# Patient Record
Sex: Female | Born: 1975 | Race: Black or African American | Hispanic: No | Marital: Married | Smoking: Never smoker
Health system: Southern US, Community
[De-identification: ages and names within clinical notes are randomized; demographics above are authoritative.]

## PROBLEM LIST (undated history)

## (undated) DIAGNOSIS — E119 Type 2 diabetes mellitus without complications: Secondary | ICD-10-CM

## (undated) DIAGNOSIS — G40802 Other epilepsy, not intractable, without status epilepticus: Principal | ICD-10-CM

## (undated) DIAGNOSIS — I10 Essential (primary) hypertension: Secondary | ICD-10-CM

## (undated) DIAGNOSIS — O09519 Supervision of elderly primigravida, unspecified trimester: Secondary | ICD-10-CM

## (undated) DIAGNOSIS — R51 Headache: Secondary | ICD-10-CM

## (undated) DIAGNOSIS — G40209 Localization-related (focal) (partial) symptomatic epilepsy and epileptic syndromes with complex partial seizures, not intractable, without status epilepticus: Secondary | ICD-10-CM

## (undated) DIAGNOSIS — R569 Unspecified convulsions: Secondary | ICD-10-CM

## (undated) HISTORY — DX: Morbid (severe) obesity due to excess calories: E66.01

## (undated) HISTORY — PX: ADENOIDECTOMY: SUR15

## (undated) HISTORY — DX: Headache: R51

## (undated) HISTORY — DX: Localization-related (focal) (partial) symptomatic epilepsy and epileptic syndromes with complex partial seizures, not intractable, without status epilepticus: G40.209

## (undated) HISTORY — DX: Other epilepsy, not intractable, without status epilepticus: G40.802

## (undated) HISTORY — PX: TYMPANOSTOMY TUBE PLACEMENT: SHX32

## (undated) HISTORY — PX: WISDOM TOOTH EXTRACTION: SHX21

## (undated) HISTORY — DX: Supervision of elderly primigravida, unspecified trimester: O09.519

## (undated) HISTORY — DX: Unspecified convulsions: R56.9

## (undated) HISTORY — PX: TONSILLECTOMY: SUR1361

---

## 2012-11-30 ENCOUNTER — Other Ambulatory Visit: Payer: Self-pay | Admitting: Neurology

## 2012-12-02 ENCOUNTER — Telehealth: Payer: Self-pay | Admitting: Neurology

## 2012-12-02 NOTE — Telephone Encounter (Signed)
Patient needs to schedule appt. Message sent to schedulers.

## 2012-12-05 ENCOUNTER — Other Ambulatory Visit: Payer: Self-pay | Admitting: Neurology

## 2012-12-07 ENCOUNTER — Encounter: Payer: Self-pay | Admitting: Neurology

## 2012-12-07 ENCOUNTER — Ambulatory Visit (INDEPENDENT_AMBULATORY_CARE_PROVIDER_SITE_OTHER): Payer: BC Managed Care – PPO | Admitting: Neurology

## 2012-12-07 ENCOUNTER — Encounter (INDEPENDENT_AMBULATORY_CARE_PROVIDER_SITE_OTHER): Payer: Self-pay

## 2012-12-07 VITALS — BP 139/79 | HR 80 | Resp 16 | Ht 63.0 in | Wt 215.0 lb

## 2012-12-07 DIAGNOSIS — G40802 Other epilepsy, not intractable, without status epilepticus: Secondary | ICD-10-CM

## 2012-12-07 HISTORY — DX: Other epilepsy, not intractable, without status epilepticus: G40.802

## 2012-12-07 MED ORDER — TEGRETOL-XR 200 MG PO TB12: 200.0000 mg | ORAL_TABLET | Freq: Two times a day (BID) | ORAL | Status: DC

## 2012-12-07 NOTE — Progress Notes (Signed)
[facility]  Provider:  Melvyn Novas, M D  Referring Provider: No ref. provider found Primary Care Physician:  Ihor Gully  Chief Complaint  Patient presents with  . Seizures    Pt states she has 3-4 mini seizures at night. Pt is also in need of her  Tegretol XL 200mg  90 day supply. Pt's vision was tested as well today. Rt eye was 20/30 Lt eye was 20/40 Pt wears glasses full time    HPI:  Laurie Trujillo is a 37 y.o. female  Is seen here as a referral/ revisit  from Dr. Anna Trujillo of [facility] [place].    This is transferred was last seen on 12-02-11 after she had suffered a seizure or presumed to be a seizure on September 13 last year. She described it as feelings when she began to have seizures before she was treated with carbamazepine that had been her or her. She hasn't had a seizure or aura many many years but the seizures that have happened before were related to the late medication intake times or to have forgotten to take medication. She also noted a trigger as possible sleep deprivation. She does not drink alcohol and she has had regular work hours nor nighttime shift work and she was productive but allowed to drive after his last visit with her. The patient has primary partial complex epilepsy and she has been very well controlled on the carbamazepine brand-name Tegretol. She's here today to get her refills.  I gave her copies of our last visit notes and lab tests.     Review of Systems: Out of a complete 14 system review, the patient complains of only the following symptoms, and all other reviewed systems are negative.   Concerned about conceiving and being on antiepileptic medication.   History   Social History  . Marital Status: Unknown    Spouse Name: N/A    Number of Children: 0  . Years of Education: college   Occupational History  .      [employer]   Social History Main Topics  . Smoking status: Never Smoker    . Smokeless tobacco: Not on file  . Alcohol Use: No  . Drug Use: No  . Sexual Activity: Not on file   Other Topics Concern  . Not on file   Social History Narrative   Pt is Rt handed married w/ no children at this time.Pt does take in caffeine at least twice a day.Pt is employed w/ [employer].    Family History  Problem Relation Age of Onset  . Diabetic kidney disease Mother   . Diabetic kidney disease Maternal Grandmother   . High blood pressure Maternal Grandmother   . High blood pressure Paternal Grandmother   . Diabetic kidney disease Maternal Uncle     Past Medical History  Diagnosis Date  . Obesities, morbid   . Complex partial seizures   . Headache(784.0)   . Seizures   . Other forms of epilepsy and recurrent seizures without mention of intractable epilepsy 12/07/2012    History reviewed. No pertinent past surgical history.  Current Outpatient Prescriptions  Medication Sig Dispense Refill  . glyBURIDE (DIABETA) 5 MG tablet       . TEGRETOL-XR 200 MG 12 hr tablet TAKE 2 TABLETS BY MOUTH EVERY MORNING & 3 TABLETS AT BEDTIME  150 tablet  0   No current facility-administered medications for this visit.    Allergies as of  12/07/2012  . (No Known Allergies)    Vitals: BP 139/79  Pulse 80  Resp 16  Ht 5\' 3"  (1.6 m)  Wt 215 lb (97.523 kg)  BMI 38.09 kg/m2 Last Weight:  Wt Readings from Last 1 Encounters:  12/07/12 215 lb (97.523 kg)   Last Height:   Ht Readings from Last 1 Encounters:  12/07/12 5\' 3"  (1.6 m)   Physical exam:  General: The patient is awake, alert and appears not in acute distress. The patient is well groomed. Head: Normocephalic, atraumatic. Neck is supple. Mallampati 3 , neck circumference: 16 , Cardiovascular:  Regular rate and rhythm , without  murmurs or carotid bruit, and without distended neck veins. Respiratory: Lungs are clear to auscultation. Skin:  Without evidence of edema, or rash Trunk: BMI is  elevated and patient   has normal posture.  Neurologic exam : The patient is awake and alert, oriented to place and time.  Memory subjective  described as intact. There is a normal attention span & concentration ability.  Speech is fluent without  dysarthria, dysphonia or aphasia. Mood and affect are appropriate.  Cranial nerves: Pupils are equal and briskly reactive to light. Funduscopic exam without  evidence of pallor or edema. Extraocular movements  in vertical and horizontal planes intact and without nystagmus. Visual fields by finger perimetry are intact. Hearing to finger rub intact.  Facial sensation intact to fine touch. Facial motor strength is symmetric and tongue and uvula move midline.  Motor exam:  Normal tone and normal muscle bulk and symmetric normal strength in all extremities.  Sensory:  Fine touch, pinprick and vibration were tested in all extremities. Proprioception is  normal.  Coordination: Rapid alternating movements in the fingers/hands is tested and normal. Finger-to-nose maneuver tested and normal without evidence of ataxia, dysmetria or tremor.  Gait and station: Patient walks without assistive device and is able and assisted stool climb up to the exam table. Strength within normal limits. Stance is stable and normal. Tandem gait is unfragmented. Romberg testing is  normal.  Deep tendon reflexes: in the  upper and lower extremities are symmetric and intact. Babinski maneuver response is downgoing.   Assessment:  After physical and neurologic examination, review of laboratory studies, imaging, neurophysiology testing and pre-existing records, assessment is   And was rare complex partial seizures and secondary generalization. Controlled on name brand Tegretol 200 mg tablets 2 in the morning 3 in p.m. 5 days total. Also the seizures have been nocturnal and have not needed it he has not been told not to drive but to avoid certain trigger factors such as sleep deprivation, she is not drinking  alcohol , knows that alcohol could be a trigger for seizures,  Plan:  Treatment plan and additional workup :  Refill TEGRETOL 200 mg brand name .  patient  TO  STAY ON tegretol FOR PREGNANCY, NEEDS to discuss with her gynecologist which predated and it measures are indicated at this time. She has been doing very well with his seizure control so I would not like to change her away from Tegretol, which is a category C. for pregnancy.  She has not been on Depakote, Dilantin or phenobarbital which are associated with higher rates of fecal malformation.  I encouraged her to take multivitamins or pregnancy multivitamins prior to conceiving at least 3 months ahead of time.  This is to prevent any vitamin K deficiency and also seems to help reduce the residual risk of  Fetal malformation in epileptic  mothers. I encourage to register with the [facility] pregnancy registry.

## 2012-12-07 NOTE — Patient Instructions (Signed)
Epilepsy and Pregnancy Epilepsy (convulsions) is a lifelong (chronic) disease that causes recurrent convulsions. These episodes are unpredictable. The convulsions happen because of a brain dysfunction when the nerve cells produce too many electrical discharges. These discharges make the body shake. There are no clues to tell whether a patient's epilepsy will be aggravated during pregnancy. Only about one quarter of pregnancies which have been seizure (convulsions) free for the 9 months before pregnancy will have seizures during pregnancy. Most women with epilepsy will have a normal, healthy baby.  RISKS TO BABY:  Partial or complete separation of placenta from uterus (partial/complete uteroplacental separation).  Late-term fetal death (stillborn)  Birth defects. This may be due to the medications used to treat epilepsy. WOMEN WITH EPILEPSY HAVE A HIGHER INCIDENCE OF:  Uncontrolled and continuous nausea and vomiting (hyperemesis gravidarum).  Vaginal bleeding.  Anemia.  Endocrine hormone disorders.  Premature labor.  Failure to progress in labor.  Increased rate of Cesarean Sections.  Menstrual problems.  Polycystic ovary syndrome. CAUSES   Some people are born with the problem.  Head trauma.  High fever over 104 F (40 C).  Brain tumors.  Infection of the brain.  Diabetic patients that took too much insulin and have too low of a blood sugar. SYMPTOMS   There may be no convulsions with only a short lapse in consciousness of thought or activity with fast fluttering of the eyelids.  Shaking and stiffness all over the body.  Shaking only in specific areas of the body.  Convulsions that occur when asleep (nocturnal epilepsy). DIAGNOSIS  The diagnosis is usually made by observing the convulsion. Other means of diagnosis are:  EEG (electroencphalogram) analyzing brain waves with electrodes.  CT scan of the brain.  MRI of the brain.  Blood tests that may help in  treating the problem. TREATMENT  Anti-epileptic drugs (AEDs) are used to treat epilepsy. AEDs can have side effects on the baby.  HOME CARE INSTRUCTIONS   Follow your caregiver's advice and treatment if you are planning to get pregnant or are already pregnant.  Have a neurologist follow your pregnancy.  Keep all your prenatal neurologist appointments.  If you are planning to get pregnant, take supplemental vitamins and folic acid (0.4 mg a day) before and during your pregnancy. Your caregiver will answer questions about your treatment and care of your seizure disorder if you are planning a pregnancy or already are pregnant. If planning a pregnancy, obtain counseling and keep up your regular prenatal appointments as directed. SEEK MEDICAL CARE IF:   You have a seizure and never had one before.  You think you are having minor or isolated seizures.  You do not feel the baby moving or the baby is not moving as it usually does.  You develop abdominal pain.  You develop vaginal bleeding with or without abdominal pain.  You develop severe headaches.  You develop vision problems or numbness on your body.  You develop uncontrolled vomiting.  You develop tiredness, weakness or faint. Pregnant women with epilepsy and taking AEDs may register with the [place] AED Pregnancy Registry. This is located at [facility], [facility] [employer]. Phone toll free: [phone]. Document Released: 02/14/2000 Document Revised: 05/11/2011 Document Reviewed: 09/14/2007 ExitCare Patient Information 2014 [employer].  

## 2013-05-12 ENCOUNTER — Ambulatory Visit: Payer: BC Managed Care – PPO | Admitting: Nurse Practitioner

## 2013-05-25 ENCOUNTER — Encounter: Payer: Self-pay | Admitting: Nurse Practitioner

## 2013-05-25 ENCOUNTER — Ambulatory Visit (INDEPENDENT_AMBULATORY_CARE_PROVIDER_SITE_OTHER): Payer: BC Managed Care – PPO | Admitting: Nurse Practitioner

## 2013-05-25 ENCOUNTER — Encounter (INDEPENDENT_AMBULATORY_CARE_PROVIDER_SITE_OTHER): Payer: Self-pay

## 2013-05-25 VITALS — BP 134/77 | HR 103 | Ht 61.0 in | Wt 208.0 lb

## 2013-05-25 DIAGNOSIS — G40802 Other epilepsy, not intractable, without status epilepticus: Secondary | ICD-10-CM

## 2013-05-25 MED ORDER — TEGRETOL-XR 200 MG PO TB12: 200.0000 mg | ORAL_TABLET | ORAL | Status: DC

## 2013-05-25 NOTE — Progress Notes (Signed)
PATIENT: Laurie Trujillo DOB: 03/19/1975  REASON FOR VISIT: follow up for complex partial seizures HISTORY FROM: patient  HISTORY OF PRESENT ILLNESS: 12/07/12 (CD): [name] was last seen on 12-02-11 after she had suffered a seizure or presumed to be a seizure on November 13 2011. She described it as feelings like when she began to have seizures before she was treated with carbamazepine. She hasn't had a seizure or aura for many years but the seizures that have happened before were related to late medication intake times or to have forgotten to take medication. She also noted a trigger as possible sleep deprivation. She does not drink alcohol and she has had regular work hours and she was productive but allowed to drive after last visit with her. The patient has primary partial complex epilepsy and she has been very well controlled on the carbamazepine brand-name Tegretol. She's here today to get her refills. I gave her copies of our last visit notes and lab tests.   05/25/13 (LL): Patient here for regular followup, last visit 12/07/12.  She has not had any seizures since last visit, doing well.  Trying to conceive, but has not been successful yet.  No complaints.  REVIEW OF SYSTEMS: Full 14 system review of systems performed and notable only for: No Complaints.  ALLERGIES: No Known Allergies  HOME MEDICATIONS: Outpatient Prescriptions Prior to Visit  Medication Sig Dispense Refill  . glyBURIDE (DIABETA) 5 MG tablet       . TEGRETOL-XR 200 MG 12 hr tablet Take 1 tablet (200 mg total) by mouth 2 (two) times daily. Take 2 AM and 3 in PM po.  450 tablet  2   No facility-administered medications prior to visit.     PHYSICAL EXAM  Filed Vitals:   05/25/13 0917  BP: 134/77  Pulse: 103  Height: 5\' 1"  (1.549 m)  Weight: 208 lb (94.348 kg)   Body mass index is 39.32 kg/(m^2).  Generalized: Well developed, in no acute distress, obese AA female Head: normocephalic and atraumatic.  Oropharynx benign  Neck: Supple, no carotid bruits  Cardiac: Regular rate rhythm, no murmur  Musculoskeletal: No deformity   Neurological examination  Mentation: Alert oriented to time, place, history taking. Follows all commands speech and language fluent Cranial nerve II-XII: Fundoscopic exam not done. Pupils were equal round reactive to light extraocular movements were full, visual field were full on confrontational test. Facial sensation and strength were normal. hearing was intact to finger rubbing bilaterally. Uvula tongue midline. head turning and shoulder shrug and were normal and symmetric.Tongue protrusion into cheek strength was normal. Motor: The motor testing reveals 5 over 5 strength of all 4 extremities. Good symmetric motor tone is noted throughout.  Sensory: Sensory testing is intact to pinprick, soft touch, vibration sensation, and position sense on all 4 extremities. No evidence of extinction is noted.  Coordination: Cerebellar testing reveals good finger-nose-finger and heel-to-shin bilaterally.  Gait and station: Gait is normal. Tandem gait is normal. Romberg is negative. No drift is seen.  Reflexes: Deep tendon reflexes are symmetric and normal bilaterally.   ASSESSMENT AND PLAN 38 y.o. year old female  has a past medical history of Obesities, morbid; Complex partial seizures; Headache(784.0); Seizures; and Other forms of epilepsy and recurrent seizures without mention of intractable epilepsy (12/07/2012). here for routine follow up for complex partial seizures and secondary generalization. Controlled on name brand Tegretol 200 mg tablets 2 in the morning 3 in p.m. 5 days total. Also the  seizures have been nocturnal.  She has not been told not to drive but to avoid certain trigger factors such as sleep deprivation, she is not drinking alcohol, knows that alcohol could be a trigger for seizures.  Plan:  Refill TEGRETOL 200 mg brand name.  Meds ordered this encounter    Medications  . TEGRETOL-XR 200 MG 12 hr tablet    Sig: Take 1 tablet (200 mg total) by mouth See admin instructions. Take 2 tablets AM and 3tablets in PM po.    Dispense:  450 tablet    Refill:  3    MUST BE BRAND NAME    Order Specific Question:  Supervising Provider    Answer:  Vickey Huger, CARMEN [[id number]]   Return in about 1 year (around 05/26/2014).  Ronal Fear, MSN, NP-C 05/25/2013, 9:33 AM [facility] [address], [address] [place], [place] [address] [phone]  Note: This document was prepared with digital dictation and possible smart phrase technology. Any transcriptional errors that result from this process are unintentional.

## 2013-05-25 NOTE — Patient Instructions (Signed)
Continue Tegretol -XR at current dose. Follow up in 1 year, sooner as needed. [facility]  Epilepsy and Pregnancy Epilepsy (convulsions) is a lifelong (chronic) disease that causes recurrent convulsions. These episodes are unpredictable. The convulsions happen because of a brain dysfunction when the nerve cells produce too many electrical discharges. These discharges make the body shake. There are no clues to tell whether a patient's epilepsy will be aggravated during pregnancy. Only about one quarter of pregnancies which have been seizure (convulsions) free for the 9 months before pregnancy will have seizures during pregnancy. Most women with epilepsy will have a normal, healthy baby.  RISKS TO BABY:  Partial or complete separation of placenta from uterus (partial/complete uteroplacental separation).  Late-term fetal death (stillborn)  Birth defects. This may be due to the medications used to treat epilepsy. WOMEN WITH EPILEPSY HAVE A HIGHER INCIDENCE OF:  Uncontrolled and continuous nausea and vomiting (hyperemesis gravidarum).  Vaginal bleeding.  Anemia.  Endocrine hormone disorders.  Premature labor.  Failure to progress in labor.  Increased rate of Cesarean Sections.  Menstrual problems.  Polycystic ovary syndrome. CAUSES   Some people are born with the problem.  Head trauma.  High fever over 104 F (40 C).  Brain tumors.  Infection of the brain.  Diabetic patients that took too much insulin and have too low of a blood sugar. SYMPTOMS   There may be no convulsions with only a short lapse in consciousness of thought or activity with fast fluttering of the eyelids.  Shaking and stiffness all over the body.  Shaking only in specific areas of the body.  Convulsions that occur when asleep (nocturnal epilepsy). DIAGNOSIS  The diagnosis is usually made by observing the convulsion. Other means of diagnosis are:  EEG (electroencphalogram) analyzing brain  waves with electrodes.  CT scan of the brain.  MRI of the brain.  Blood tests that may help in treating the problem. TREATMENT  Anti-epileptic drugs (AEDs) are used to treat epilepsy. AEDs can have side effects on the baby.  HOME CARE INSTRUCTIONS   Follow your caregiver's advice and treatment if you are planning to get pregnant or are already pregnant.  Have a neurologist follow your pregnancy.  Keep all your prenatal neurologist appointments.  If you are planning to get pregnant, take supplemental vitamins and folic acid (0.4 mg a day) before and during your pregnancy. Your caregiver will answer questions about your treatment and care of your seizure disorder if you are planning a pregnancy or already are pregnant. If planning a pregnancy, obtain counseling and keep up your regular prenatal appointments as directed. SEEK MEDICAL CARE IF:   You have a seizure and never had one before.  You think you are having minor or isolated seizures.  You do not feel the baby moving or the baby is not moving as it usually does.  You develop abdominal pain.  You develop vaginal bleeding with or without abdominal pain.  You develop severe headaches.  You develop vision problems or numbness on your body.  You develop uncontrolled vomiting.  You develop tiredness, weakness or faint. Pregnant women with epilepsy and taking AEDs may register with the [place] A[place] Pregnancy Registry. This is located at [facility]. Phone toll free: [phone]. Document Released: 02/14/2000 Document Revised: 05/11/2011 Document Reviewed: 09/14/2007 [facility] Patient Information 2014 [place], [place].

## 2013-05-29 ENCOUNTER — Other Ambulatory Visit (HOSPITAL_COMMUNITY): Payer: Self-pay | Admitting: Obstetrics and Gynecology

## 2013-05-29 DIAGNOSIS — Z3141 Encounter for fertility testing: Secondary | ICD-10-CM

## 2013-06-06 ENCOUNTER — Ambulatory Visit (HOSPITAL_COMMUNITY)
Admission: RE | Admit: 2013-06-06 | Discharge: 2013-06-06 | Disposition: A | Discharge status: Home / Self Care | Payer: BC Managed Care – PPO | Source: Ambulatory Visit | Attending: Obstetrics and Gynecology | Admitting: Obstetrics and Gynecology

## 2013-06-06 DIAGNOSIS — N979 Female infertility, unspecified: Secondary | ICD-10-CM | POA: Insufficient documentation

## 2013-06-06 DIAGNOSIS — Z3141 Encounter for fertility testing: Secondary | ICD-10-CM

## 2013-06-06 MED ORDER — IOHEXOL 300 MG/ML  SOLN
20.0000 mL | Freq: Once | INTRAMUSCULAR | Status: AC | PRN
  Administered 2013-06-06: 50 mL

## 2013-09-07 ENCOUNTER — Telehealth: Payer: Self-pay | Admitting: Neurology

## 2013-09-07 DIAGNOSIS — G40802 Other epilepsy, not intractable, without status epilepticus: Secondary | ICD-10-CM

## 2013-09-07 MED ORDER — TEGRETOL-XR 200 MG PO TB12: ORAL_TABLET | ORAL | Status: DC

## 2013-09-07 NOTE — Telephone Encounter (Signed)
Patient calling to check on the status of a fax that was sent regarding Tegretol medication and whether it was okay to get a 90 day supply. Please return call to patient and advise.

## 2013-09-07 NOTE — Telephone Encounter (Signed)
I called back.  She would like a 90 day Rx sent to [facility].  Rx has been sent.  Patient is aware.

## 2013-09-08 ENCOUNTER — Telehealth: Payer: Self-pay | Admitting: Neurology

## 2013-09-08 DIAGNOSIS — G40802 Other epilepsy, not intractable, without status epilepticus: Secondary | ICD-10-CM

## 2013-09-08 MED ORDER — TEGRETOL-XR 200 MG PO TB12: ORAL_TABLET | ORAL | Status: DC

## 2013-09-08 NOTE — Telephone Encounter (Signed)
Rx has been sent.  I called the patient back.  She is aware.  

## 2013-09-08 NOTE — Telephone Encounter (Signed)
Patient calling to request a 10 day supply of [facility] [address] so that she can have some while she's waiting for the home delivery. Please return call to patient and advise.

## 2013-09-21 ENCOUNTER — Telehealth: Payer: Self-pay | Admitting: Neurology

## 2013-09-21 NOTE — Telephone Encounter (Signed)
I called the number provided.  Spoke with Victorino DikeJennifer in the prior [employer] dept.  She said there was nothing she could assist with, as a prior auth was not required.  She transferred me to the Administrative Review Unit.  I was on hold for nearly 30 minutes.  I spoke with a different Jennifer.  They were able to approve the request effective until 09/21/2014 Ref # [id number].  They will notify patient of approval.   States the 90 day co-pay will now be about $120.  I have advised local pharmacy of approval so they can reprocess Rx for patient to pick up.

## 2013-09-21 NOTE — Telephone Encounter (Signed)
Patient stated TEGRETOL-XR 200 MG 12 hr tablet was too expensive and can't take generic brand.  [facility] advised patient to have our office call [phone] and state patient can only take brand name.  They would cut the cost and back date.  Patient has enough meds for today, but tomorrow she has only enough for morning dose.  Please call and advise.

## 2013-09-22 ENCOUNTER — Telehealth: Payer: Self-pay | Admitting: Neurology

## 2013-09-22 NOTE — Telephone Encounter (Signed)
Patient following up on status of Tegretol per last message, please return call to patient and advise.

## 2013-09-22 NOTE — Telephone Encounter (Signed)
This was already approved, please see previous note.  I called back.  Patient is aware.

## 2014-05-28 ENCOUNTER — Ambulatory Visit: Payer: BC Managed Care – PPO | Admitting: Neurology

## 2014-05-28 ENCOUNTER — Ambulatory Visit (INDEPENDENT_AMBULATORY_CARE_PROVIDER_SITE_OTHER): Payer: BLUE CROSS/BLUE SHIELD | Admitting: Neurology

## 2014-05-28 ENCOUNTER — Encounter: Payer: Self-pay | Admitting: Neurology

## 2014-05-28 ENCOUNTER — Ambulatory Visit: Payer: BC Managed Care – PPO | Admitting: Nurse Practitioner

## 2014-05-28 VITALS — BP 119/76 | HR 98 | Resp 14 | Wt 198.4 lb

## 2014-05-28 DIAGNOSIS — G40209 Localization-related (focal) (partial) symptomatic epilepsy and epileptic syndromes with complex partial seizures, not intractable, without status epilepticus: Secondary | ICD-10-CM

## 2014-05-28 MED ORDER — TEGRETOL-XR 200 MG PO TB12: ORAL_TABLET | ORAL | Status: DC

## 2014-05-28 MED ORDER — TEGRETOL-XR 200 MG PO TB12
ORAL_TABLET | ORAL | Status: DC
Start: 2014-05-28 — End: 2014-05-28

## 2014-05-28 MED ORDER — CARBAMAZEPINE ER 200 MG PO CP12: 200.0000 mg | ORAL_CAPSULE | Freq: Two times a day (BID) | ORAL | Status: DC

## 2014-05-28 NOTE — Progress Notes (Signed)
PATIENT: Laurie Trujillo DOB: 03/28/1975  REASON FOR VISIT: follow up for complex partial seizures, 2 events in February and March 20 th 2016.  HISTORY FROM: patient  HISTORY OF PRESENT ILLNESS: 12/07/12 (CD): Laurie Trujillo was last seen on 12-02-11 after she had suffered a seizure or presumed to be a seizure on November 13 2011. She described it as feelings like when she began to have seizures before she was treated with carbamazepine. She hasn't had a seizure or aura for many years but the seizures that have happened before were related to late medication intake times or to have forgotten to take medication. She also noted a trigger as possible sleep deprivation. She does not drink alcohol and she has had regular work hours and she was productive but allowed to drive after last visit with her. The patient has primary partial complex epilepsy and she has been very well controlled on the carbamazepine brand-name Tegretol. She's here today to get her refills. I gave her copies of our last visit notes and lab tests.  05/25/13 (LL): Patient here for regular followup, last visit 12/07/12.  She has not had any seizures since last visit, doing well.  Trying to conceive, but has not been successful yet.  No complaints.  Interval history 05-28-14, Laurie Trujillo had been seizure-free from October 2014 until Fabry 2016 when she suffered a seizure during her sleep, of which she was not aware. Her husband witnessed the event and then another nocturnal seizure on March 20 last week. Both seizures ended without any self injury she did not have a tongue bite, there is no bruising noted. She did not become cyanotic or short any sign of physiologic distress. There was no associated bowel bladder or bowel incontinence. Her husband did not make any attempts to wake her and I'm not sure how long she may have been confused if there was such a possible postictal. She has noticed that both of these nocturnal spells happened after she  took her medication in a more irregular time. We discussed today of the should put her on an she's currently taking Tegretol-XR 200 mg 12 hour release form 2 in the morning 3 in the afternoon 12 hours apart. I would like to try to put the patient on a once a day Tegretol form. This will make her compliance easier. Patients with nocturnal seizure only are not refrained from driving.  We dicussed alcohol, sleep deprivation , flashing strobe lights as possible triggers.  Her last MRI of the brain was at [facility], under Dr. Jennet Maduro.     REVIEW OF SYSTEMS: Full 14 system review of systems performed and notable only for: No Complaints.  ALLERGIES: Allergies  Allergen Reactions  . Metformin And Related Nausea Only    HOME MEDICATIONS: Outpatient Prescriptions Prior to Visit  Medication Sig Dispense Refill  . Multiple Vitamin (MULTIVITAMIN) tablet Take 1 tablet by mouth daily. Women daily MVI    . TEGRETOL-XR 200 MG 12 hr tablet Take 2 tablets po in the AM and 3 tablets po in PM Brand Medically Necessary 50 tablet 1  . Folic Acid-Vit B6-Vit B12 (FOLBEE) 2.5-25-1 MG TABS tablet Take 1 tablet by mouth daily.    Marland Kitchen glyBURIDE (DIABETA) 5 MG tablet     . hydrochlorothiazide (HYDRODIURIL) 50 MG tablet Take 50 tablets by mouth daily.     No facility-administered medications prior to visit.     PHYSICAL EXAM  Filed Vitals:   05/28/14 1553  BP: 119/76  Pulse: 98  Resp: 14  Weight: 198 lb 6.4 oz (89.994 kg)   Body mass index is 37.51 kg/(m^2).  Generalized: Well developed, in no acute distress, obese AA female Head: normocephalic and atraumatic. Oropharynx benign  Neck: Supple, no carotid bruits  Cardiac: Regular rate rhythm, no murmur  Musculoskeletal: No deformity   Neurological examination  Mentation: Alert oriented to time, place, history taking. Follows all commands speech and language fluent Cranial nerve II-XII: Fundoscopic exam not done. Pupils were equal round reactive to  light extraocular movements were full, visual field were full on confrontational test. Facial sensation and strength were normal. hearing was intact to finger rubbing bilaterally. Uvula tongue midline. head turning and shoulder shrug and were normal and symmetric.Tongue protrusion into cheek strength was normal. Motor: The motor testing reveals 5 over 5 strength of all 4 extremities. Good symmetric motor tone is noted throughout.  Sensory: Sensory testing is intact to pinprick, soft touch, vibration sensation, and position sense on all 4 extremities. No evidence of extinction is noted.  Coordination: Cerebellar testing reveals good finger-nose-finger and heel-to-shin bilaterally.  Gait and station: Gait is normal. Tandem gait is normal. Romberg is negative. No drift is seen.  Reflexes: Deep tendon reflexes are symmetric and normal bilaterally.   ASSESSMENT AND PLAN 39 y.o. year old female  has a past medical history of Obesities, morbid; Complex partial seizures; Headache(784.0); Seizures; and Other forms of epilepsy and recurrent seizures without mention of intractable epilepsy (12/07/2012). here for routine follow up for complex partial seizures and secondary generalization.   Well Controlled on name brand Tegretol 200 mg tablets 2 in the morning 3 in p.m.  As long as compliant.    The patient is wanting a baby, and plans to get pregenant soon. We need to revist the issue of 12 hour Tegretol versus a different medication for pregnancy safety. All the seizures have been nocturnal.  I will also enlist the patient once pregnant into the [employer] pregnancy and seizure medication study. I will provide a printed handout about pregnancy and safety of antiepileptic drugs to the patient. In many cases we can also bridged over with low-dose benzodiazepines to be used at night.  Visit duration and discussion of pregnancy risk in epilepsy and on epileptic medication took 20 plus minutes of the face to face  time.     Meds ordered this encounter  Medications   05/28/2014, 4:20 PM [facility] [address], [address] [place], [place] [address] [phone]  Note: This document was prepared with digital dictation and possible smart phrase technology. Any transcriptional errors that result from this process are unintentional.

## 2014-05-28 NOTE — Patient Instructions (Signed)
Carbamazepine extended-release tablets What is this medicine? CARBAMAZEPINE (kar ba MAZ e peen) is used to control seizures caused by certain types of epilepsy. This medicine is also used to treat nerve related pain. It is not for common aches and pains. This medicine may be used for other purposes; ask your health care provider or pharmacist if you have questions. COMMON BRAND NAME(S): Tegretol -XR What should I tell my health care provider before I take this medicine? They need to know if you have any of these conditions: -Asian ancestry -bone marrow disease -glaucoma -heart disease or irregular heartbeat -kidney disease -liver disease -low blood counts, like low white cell, platelet, or red cell counts -porphyria -psychotic disorders -suicidal thoughts, plans, or attempt; a previous suicide attempt by you or a family member -an unusual or allergic reaction to carbamazepine, tricyclic antidepressants, phenytoin, phenobarbital or other medicines, foods, dyes, or preservatives -pregnant or trying to get pregnant -breast-feeding How should I use this medicine? Take this medicine by mouth with a glass of water. Follow the directions on the prescription label. Swallow whole, do not cut, crush or chew. The tablets should be inspected for chips or cracks. Do not take any tablets that are damaged. Take this medicine with food. Take your doses at regular intervals. Do not take your medicine more often than directed. Do not stop taking this medicine except on the advice of your doctor or health care professional. A special MedGuide will be given to you by the pharmacist with each prescription and refill. Be sure to read this information carefully each time. Talk to your pediatrician regarding the use of this medicine in children. Special care may be needed. Overdosage: If you think you have taken too much of this medicine contact a poison control center or emergency room at once. NOTE: This medicine  is only for you. Do not share this medicine with others. What if I miss a dose? If you miss a dose, take it as soon as you can. If it is almost time for your next dose, take only that dose. Do not take double or extra doses. What may interact with this medicine? Do not take this medicine with any of the following medications: -delavirdine -MAOIs like Carbex, Eldepryl, Marplan, Nardil, and Parnate -nefazodone -oxcarbazepine This medicine may also interact with the following medications: -acetaminophen -acetazolamide -barbiturate medicines for inducing sleep or treating seizures, like phenobarbital -certain antibiotics like clarithromycin, erythromycin or troleandomycin -cimetidine -cyclosporine -danazol -dicumarol -doxycycline -female hormones, including estrogens and birth control pills -grapefruit juice -isoniazid, INH -levothyroxine and other thyroid hormones -lithium and other medicines to treat mood problems or psychotic disturbances -loratadine -medicines for angina or high blood pressure -medicines for cancer -medicines for depression or anxiety -medicines for sleep -medicines to treat fungal infections, like fluconazole, itraconazole or ketoconazole -medicines used to treat HIV infection or AIDS -methadone -niacinamide -praziquantel -propoxyphene -rifampin or rifabutin -seizure or epilepsy medicine -steroid medicines such as prednisone or cortisone -theophylline -tramadol -warfarin This list may not describe all possible interactions. Give your health care provider a list of all the medicines, herbs, non-prescription drugs, or dietary supplements you use. Also tell them if you smoke, drink alcohol, or use illegal drugs. Some items may interact with your medicine. What should I watch for while using this medicine? Visit your doctor or health care professional for a regular check on your progress. Do not change brands or dosage forms of this medicine without discussing  the change with your doctor or health care professional.  If you are taking this medicine for epilepsy (seizures) do not stop taking it suddenly. This increases the risk of seizures. Wear a [employer] Alert bracelet or necklace. Carry an identification card with information about your condition, medications, and doctor or health care professional. Bonita QuinYou may get drowsy, dizzy, or have blurred vision. Do not drive, use machinery, or do anything that needs mental alertness until you know how this medicine affects you. To reduce dizzy or fainting spells, do not sit or stand up quickly, especially if you are an older patient. Alcohol can increase drowsiness and dizziness. Avoid alcoholic drinks. Birth control pills may not work properly while you are taking this medicine. Talk to your doctor about using an extra method of birth control. This medicine can make you more sensitive to the sun. Keep out of the sun. If you cannot avoid being in the sun, wear protective clothing and use sunscreen. Do not use sun lamps or tanning beds/booths. The coating on the tablets is not absorbed in the body and you may notice it in your stool. This is no cause for concern. The use of this medicine may increase the chance of suicidal thoughts or actions. Pay special attention to how you are responding while on this medicine. Any worsening of mood, or thoughts of suicide or dying should be reported to your health care professional right away. Women who become pregnant while using this medicine may enroll in the [place] Antiepileptic Drug Pregnancy Registry by calling [phone]. This registry collects information about the safety of antiepileptic drug use during pregnancy. What side effects may I notice from receiving this medicine? Side effects that you should report to your doctor or health care professional as soon as possible: -allergic reactions like skin rash, itching or hives, swelling of the face, lips, or  tongue -breathing problems -changes in vision -confusion -dark urine -fast or irregular heartbeat -fever or chills, sore throat -mouth ulcers -pain or difficulty passing urine -redness, blistering, peeling or loosening of the skin, including inside the mouth -ringing in the ears -seizures -stomach pain -swollen joints or muscle/joint aches and pains -unusual bleeding or bruising -unusually weak or tired -vomiting -worsening of mood, thoughts or actions of suicide or dying -yellowing of the eyes or skin Side effects that usually do not require medical attention (report to your doctor or health care professional if they continue or are bothersome): -clumsiness or unsteadiness -diarrhea or constipation -headache -increased sweating -nausea This list may not describe all possible side effects. Call your doctor for medical advice about side effects. You may report side effects to FDA at 1-800-FDA-1088. Where should I keep my medicine? Keep out of reach of children. Store at room temperature between 15 and 30 degrees C (59 and 86 degrees F). Keep container tightly closed. Protect from moisture. Throw away any unused medicine after the expiration date. NOTE: This sheet is a summary. It may not cover all possible information. If you have questions about this medicine, talk to your doctor, pharmacist, or health care provider.  2015, Elsevier/Gold Standard. (2012-04-26 15:30:31)

## 2014-07-24 ENCOUNTER — Telehealth: Payer: Self-pay | Admitting: Neurology

## 2014-07-24 DIAGNOSIS — G40209 Localization-related (focal) (partial) symptomatic epilepsy and epileptic syndromes with complex partial seizures, not intractable, without status epilepticus: Secondary | ICD-10-CM

## 2014-07-24 MED ORDER — TEGRETOL-XR 200 MG PO TB12: ORAL_TABLET | ORAL | Status: DC

## 2014-07-24 NOTE — Telephone Encounter (Signed)
I called back.  Got no answer.  Left message.  If patient calls back, please verify if she is pregnant or not, as this is the info [facility] is requesting.  Thank you!

## 2014-07-24 NOTE — Telephone Encounter (Signed)
Patient called returning Jessica's phone call. Please call and advise. Patient can be reached at [phone].

## 2014-07-24 NOTE — Telephone Encounter (Signed)
Pt returned call. Please call and advise °

## 2014-07-24 NOTE — Telephone Encounter (Signed)
Patient is calling as she is out of her Tegretol-XR 200 mg. She normally receives by mail order [facility].  She is being told they are waiting for Dr. Vickey Hugerohmeier to approve before they will send.  Until she can get her regular Rx she needs a few pills to get [facility] [address]. Please call.

## 2014-07-24 NOTE — Telephone Encounter (Signed)
I called again.  Spoke with patient who verified she is not pregnant.  [facility] has been notified, and [facility].

## 2014-07-24 NOTE — Telephone Encounter (Signed)
I contacted [facility] at [phone].  They state the patient recently filled a Rx for Clomiphene Citrate from another provider, so they wanted to clarify if the patient is pregnant or not before refilling Tegretol Ref Case# [id number] Rx # [id number] Inv # [id number].  I called the patient back to verify info requested by [facility].  Got no answer.  Left message.

## 2014-10-22 ENCOUNTER — Telehealth: Payer: Self-pay | Admitting: Neurology

## 2014-10-22 NOTE — Telephone Encounter (Signed)
John with [facility] is calling and inquiring if this patient is pregnant in that Tegretol XR should not be taken during pregnancy. He has not been able to contact the patient.  Please call [phone] using reference #[id number].  Thanks!

## 2014-10-22 NOTE — Telephone Encounter (Signed)
I called the patient to verify wether or not she is currently pregnant, as [facility] is again wanting to verify this info.  Got no answer.  Left message.

## 2014-10-23 NOTE — Telephone Encounter (Signed)
I called [facility] back to relay this info.  Spoke with pharmacist Olegario Messier.  Says they tried to reach patient multiple times, but were not successful.  Advised the patient contacted [place] and stated she is not pregnant at the present time.  They will note file and proceed with order as prescribed.

## 2014-10-23 NOTE — Telephone Encounter (Signed)
Patient called back and stated she is not pregnant but she is trying to get pregnant.  Please call.

## 2014-10-29 ENCOUNTER — Other Ambulatory Visit: Payer: Self-pay

## 2014-10-29 DIAGNOSIS — G40209 Localization-related (focal) (partial) symptomatic epilepsy and epileptic syndromes with complex partial seizures, not intractable, without status epilepticus: Secondary | ICD-10-CM

## 2014-10-29 MED ORDER — TEGRETOL-XR 200 MG PO TB12: ORAL_TABLET | ORAL | Status: DC

## 2014-10-29 NOTE — Telephone Encounter (Signed)
Rx has been sent per request.  I called back to advise.  Got no answer, left message.

## 2014-10-29 NOTE — Telephone Encounter (Signed)
Patient called requesting 15 days of Teg[facility] to [facility] [address]. She will be out before mailed order arrives. Please call and advise. She can be reached at [phone].

## 2014-11-06 ENCOUNTER — Telehealth: Payer: Self-pay | Admitting: Neurology

## 2014-11-06 DIAGNOSIS — G40209 Localization-related (focal) (partial) symptomatic epilepsy and epileptic syndromes with complex partial seizures, not intractable, without status epilepticus: Secondary | ICD-10-CM

## 2014-11-06 MED ORDER — TEGRETOL-XR 200 MG PO TB12: ORAL_TABLET | ORAL | Status: DC

## 2014-11-06 NOTE — Telephone Encounter (Signed)
Ins has responded stating they have approved the request for lower tier (co-pay) on Tegretol effective until 11/06/2015 Ref # [id number] Unfortunately, they do not disclose the new co-pay amount.  I called the patient back to advise.  She expressed understanding and appreciation.

## 2014-11-06 NOTE — Telephone Encounter (Signed)
Patient is calling to follow up on waiver form for Tegretol. Patient just got off the phone with [facility] and they told her  They're still waiting, they haven't received anything yet from [place].

## 2014-11-06 NOTE — Telephone Encounter (Signed)
Pt called and needs a brand generic waiver form filled out forTEGRETOL-XR 200 MG 12 hr tablet  and sent to [facility], phone-[phone]. Her copay is $300, and can not afford that for the brand name. May call pt at [phone]. She is also running low on it and would like to know if she can have a 15 day supply [facility] [address], [place].

## 2014-11-06 NOTE — Telephone Encounter (Signed)
15 day supply + 1 additional [facility].  [facility] has been contacted.  I spoke with Rosalita Chessman.  She reviewed the patients file and says they do have this on file, but her plan may have changed.  I was on hold for nearly 20 minutes while she checked into the policy.  She took clinical info and forwarded request for review Ref # [id number]

## 2014-11-06 NOTE — Telephone Encounter (Signed)
Per Patient,  ID# [id number] to attach with waiver form for Tegretol.

## 2014-11-29 ENCOUNTER — Ambulatory Visit (INDEPENDENT_AMBULATORY_CARE_PROVIDER_SITE_OTHER): Payer: BLUE CROSS/BLUE SHIELD | Admitting: Neurology

## 2014-11-29 ENCOUNTER — Encounter: Payer: Self-pay | Admitting: Neurology

## 2014-11-29 VITALS — BP 132/80 | HR 78 | Resp 20 | Ht 61.0 in | Wt 205.0 lb

## 2014-11-29 DIAGNOSIS — G40209 Localization-related (focal) (partial) symptomatic epilepsy and epileptic syndromes with complex partial seizures, not intractable, without status epilepticus: Secondary | ICD-10-CM

## 2014-11-29 DIAGNOSIS — N979 Female infertility, unspecified: Secondary | ICD-10-CM | POA: Diagnosis not present

## 2014-11-29 MED ORDER — TEGRETOL-XR 200 MG PO TB12: ORAL_TABLET | ORAL | Status: DC

## 2014-11-29 MED ORDER — LEVETIRACETAM ER 500 MG PO TB24: 500.0000 mg | ORAL_TABLET | Freq: Every day | ORAL | Status: DC

## 2014-11-29 NOTE — Progress Notes (Signed)
PATIENT: Laurie Trujillo DOB: 06/29/1975  REASON FOR VISIT: follow up for complex partial seizures, 2 events in February and March 20 th 2016.  HISTORY FROM: patient  HISTORY OF PRESENT ILLNESS: 12/07/12 (CD): Laurie Trujillo was last seen on 12-02-11 after she had suffered a seizure or presumed to be a seizure on September 13th  2013. She described it as a feeling  Familiar to her before she was treated with carbamazepine.  She hasn't had a seizure or aura for many years but the seizures that have happened before were related to late medication intake times or to have forgotten to take medication. She also noted a trigger as possible sleep deprivation. She does not drink alcohol and she has had regular work hours and she was productive but allowed to drive after last visit with her. The patient has primary partial complex epilepsy and she has been very well controlled on the carbamazepine brand-name Tegretol. She's here today to get her refills. I gave her copies of our last visit notes and lab tests.  05/25/13 (LL): Patient here for regular followup, last visit 12/07/12.  She has not had any seizures since last visit, doing well.  Trying to conceive, but has not been successful yet.  No complaints.  Interval history 05-28-14, Laurie Trujillo had been seizure-free from October 2014 until February 2016 , when she suffered a seizure during her sleep, of which she was not aware.  Her husband witnessed the event and then another nocturnal seizure on March 20- 16,  last week. Both seizures ended without any self injury she did not have a tongue bite, there is no bruising noted. She did not become cyanotic or short any sign of physiologic distress. There was no associated bowel bladder or bowel incontinence. Her husband did not make any attempts to wake her and I'm not sure how long she may have been confused if there was such a possible postictal. She has noticed that both of these nocturnal spells happened after she  took her medication in a more irregular time. We discussed today of the should put her on an she's currently taking Tegretol-XR 200 mg 12 hour release form 2 in the morning 3 in the afternoon 12 hours apart. I would like to try to put the patient on a once a day Tegretol form. This will make her compliance easier. Patients with nocturnal seizure only are not refrained from driving.  We dicussed alcohol, sleep deprivation , flashing strobe lights as possible triggers.  Her last MRI of the brain was at [facility] neurology, under Dr. Jennet Maduro.    Interval history from 9-20 9-16. Since the patient was last seen in [place] 2016 she had 4 more seizures. She is currently still on hormonal therapy with carbamazepine.  She has been compliant with the medication.  Complicating the issue is that she is going through fertility treatments locally with Dr. Fermin Schwab, MD. she is beginnin treatment in association with progesterone in sesame oil, Medrol tablets,  doxycycline 100 mg tablets and of the vivelle dot .patch 0.1 mg. She also takesMenopur injections. 0.25 milligrams for 7 days during that treatment cycle. She will then use beta  HCG in injectable form.  Progesterone has been suspected to decrease his she is a fresh old for this reason I would like for her to use Keppra as an associated drug,  that is not known to influence fertility and has no effect on hepatic or renal function.  I would consider keeping  her on her  CBZ medication throughout pregnancy. We will not use phenytoin, topiramate, or Depakote for obvious reasons.   REVIEW OF SYSTEMS: Full 14 system review of systems performed and notable only for: No Complaints.  ALLERGIES: Allergies  Allergen Reactions  . Metformin And Related Nausea Only    HOME MEDICATIONS: Outpatient Prescriptions Prior to Visit  Medication Sig Dispense Refill  . folic acid (FOLVITE) 1 MG tablet TAKE 1 TABLET BY MOUTH EVERY DAY    . glipiZIDE (GLUCOTROL XL) 10 MG 24  hr tablet Take 10 mg by mouth.    . Multiple Vitamin (MULTIVITAMIN) tablet Take 1 tablet by mouth daily. Women daily MVI    . Prenatal Vit-Fe Fumarate-FA (PRENATAL ONE DAILY) 27-0.8 MG TABS Take 1 tablet by mouth.    . TEGRETOL-XR 200 MG 12 hr tablet Take 2 tablets po in the AM and 3 tablets po in PM Brand Medically Necessary 75 tablet 1  . atorvastatin (LIPITOR) 20 MG tablet Take 20 mg by mouth daily at 6 PM.     . carbamazepine (CARBATROL) 200 MG 12 hr capsule Take 1 capsule (200 mg total) by mouth 2 (two) times daily. 450 capsule 3  . exenatide (BYETTA) 10 MCG/0.04ML SOPN injection Inject 10 mcg into the skin.    Marland Kitchen lisinopril-hydrochlorothiazide (PRINZIDE,ZESTORETIC) 10-12.5 MG per tablet TAKE 1 TABLET BY MOUTH EVERY DAY     No facility-administered medications prior to visit.     PHYSICAL EXAM  Filed Vitals:   11/29/14 1549  BP: 132/80  Pulse: 78  Resp: 20  Height:  (1.549 m)  Weight: 205 lb (92.987 kg)   Body mass index is 38.75 kg/(m^2).  Generalized: Well developed, in no acute distress, obese female Head: normocephalic and atraumatic. Oropharynx benign , Mallampati 3 and neck circumference is 15 inches.  Neck: Supple, no carotid bruits  Cardiac: Regular rate rhythm, no murmur  Musculoskeletal: No deformity   Neurological examination  Mentation: Alert oriented to time, place, history taking. Follows all commands speech and language fluent Cranial nerve:  No change in taste or smell. Fundoscopic exam not done. Pupils were equal round reactive to light extraocular movements were full, visual field were full on confrontational test. NO NYSTAGMUS>  Facial sensation and strength were normal. hearing was intact to finger rubbing bilaterally. Uvula tongue midline. head turning and shoulder shrug and were normal and symmetric.Tongue protrusion into cheek strength was normal. Shoulder shrug is symmetric.  Motor: The motor testing reveals 5 /5 strength of all 4 extremities. Good  symmetric motor tone is noted throughout.  Sensory: Sensory testing is intact to pinprick, soft touch, vibration sensation, and position sense on all 4 extremities. No evidence of extinction is noted.  Coordination: Cerebellar testing reveals good finger-nose-finger and heel-to-shin bilaterally.  Gait and station: Gait is normal. Tandem gait is normal. Romberg is negative. No drift is seen.  Reflexes: Deep tendon reflexes are symmetric and normal bilaterally.   ASSESSMENT AND PLAN 39 y.o. year old female seen in  routine follow up for complex partial seizures and secondary generalization. Additional questions of fertility treatment and pregnancy related medication side effects.   Well Controlled on NAME BRAND  Tegretol 200 mg tablets 2 in the morning 3 in p.m. , compliant.  Added keppra 250 mg at night only,    The patient is enrolled in fertility treatements , IVF  .  We need to revist the issue of 12 hour Tegretol versus a different medication for pregnancy safety.  All the seizures have been nocturnal.  I will also enlist the patient once pregnant into the [employer] pregnancy and seizure medication study.  I will provide a printed handout about pregnancy and safety of antiepileptic drugs to the patient.  I added Keppra 250 mg at night , to be increased to 500 mg if tolerated at night only.   Visit duration and discussion of pregnancy risk in epilepsy and on epileptic medication took 25 plus minutes of the face to face time.    Meds ordered this encounter  Medications    Melvyn Novas, MD   11/29/2014, 4:06 PM [facility] [address] [place], [place] [address] [phone]

## 2014-11-29 NOTE — Patient Instructions (Addendum)
Levetiracetam extended-release tablets What is this medicine? LEVETIRACETAM (lee ve tye RA se tam) is an antiepileptic drug. It is used with other medicines to treat certain types of seizures. This medicine may be used for other purposes; ask your health care provider or pharmacist if you have questions. COMMON BRAND NAME(S): Keppra XR What should I tell my health care provider before I take this medicine? They need to know if you have any of these conditions: -kidney disease -suicidal thoughts, plans, or attempt; a previous suicide attempt by you or a family member -an unusual or allergic reaction to levetiracetam, other medicines, foods, dyes, or preservatives -pregnant or trying to get pregnant -breast-feeding How should I use this medicine? Take this medicine by mouth with a glass of water. Follow the directions on the prescription label. Do not cut, crush or chew this medicine. You may take this medicine with or without food. Take your doses at regular intervals. Do not take your medicine more often than directed. Do not stop taking this medicine or any of your seizure medicines unless instructed by your doctor or health care professional. Stopping your medicine suddenly can increase your seizures or their severity. A special MedGuide will be given to you by the pharmacist with each prescription and refill. Be sure to read this information carefully each time. Contact your pediatrician or health care professional regarding the use of this medication in children. While this drug may be prescribed for children as young as 12 years of age for selected conditions, precautions do apply. Overdosage: If you think you have taken too much of this medicine contact a poison control center or emergency room at once. NOTE: This medicine is only for you. Do not share this medicine with others. What if I miss a dose? If you miss a dose and it has only been a few hours, take it as soon as you can. If it is  almost time for your next dose, take only that dose. Do not take double or extra doses. What may interact with this medicine? This medicine may interact with the following medications: -carbamazepine -colesevelam -probenecid -sevelamer This list may not describe all possible interactions. Give your health care provider a list of all the medicines, herbs, non-prescription drugs, or dietary supplements you use. Also tell them if you smoke, drink alcohol, or use illegal drugs. Some items may interact with your medicine. What should I watch for while using this medicine? Visit your doctor or health care professional for a regular check on your progress. Wear a medical identification bracelet or chain to say you have epilepsy, and carry a card that lists all your medications. It is important to take this medicine exactly as instructed by your health care professional. When first starting treatment, your dose may need to be adjusted. It may take weeks or months before your dose is stable. You should contact your doctor or health care professional if your seizures get worse or if you have any new types of seizures. You may get drowsy or dizzy. Do not drive, use machinery, or do anything that needs mental alertness until you know how this medicine affects you. Do not stand or sit up quickly, especially if you are an older patient. This reduces the risk of dizzy or fainting spells. Alcohol may interfere with the effect of this medicine. Avoid alcoholic drinks. The use of this medicine may increase the chance of suicidal thoughts or actions. Pay special attention to how you are responding while on this   medicine. Any worsening of mood, or thoughts of suicide or dying should be reported to your health care professional right away. Women who become pregnant while using this medicine may enroll in the [place] Antiepileptic Drug Pregnancy Registry by calling [phone]. This registry collects information  about the safety of antiepileptic drug use during pregnancy. What side effects may I notice from receiving this medicine? Side effects you should report to your doctor or health care professional as soon as possible: -allergic reactions like skin rash, itching or hives, swelling of the face, lips, or tongue -breathing problems -changes in emotions or moods -dark urine -general ill feeling or flu-like symptoms -problems with balance, talking, walking -suicidal thoughts or actions -unusually weak or tired -yellowing of the eyes or skin Side effects that usually do not require medical attention (report to your doctor or health care professional if they continue or are bothersome): -diarrhea -dizzy, drowsy -headache -loss of appetite This list may not describe all possible side effects. Call your doctor for medical advice about side effects. You may report side effects to FDA at 1-800-FDA-1088. Where should I keep my medicine? Keep out of reach of children. Store at room temperature between 15 and 30 degrees C (59 and 86 degrees F). Throw away any unused medicine after the expiration date. NOTE: This sheet is a summary. It may not cover all possible information. If you have questions about this medicine, talk to your doctor, pharmacist, or health care provider.  2015, Elsevier/Gold Standard. (2013-01-10 98:11:91) Carbamazepine tablets What is this medicine? CARBAMAZEPINE (kar ba MAZ e peen) is used to control seizures caused by certain types of epilepsy. This medicine is also used to treat nerve related pain. It is not for common aches and pains. This medicine may be used for other purposes; ask your health care provider or pharmacist if you have questions. COMMON BRAND NAME(S): Epitol, Tegretol What should I tell my health care provider before I take this medicine? They need to know if you have any of these conditions: -Asian ancestry -bone marrow disease -glaucoma -heart disease or  irregular heartbeat -kidney disease -liver disease -low blood counts, like low white cell, platelet, or red cell counts -porphyria -psychotic disorders -suicidal thoughts, plans, or attempt; a previous suicide attempt by you or a family member -an unusual or allergic reaction to carbamazepine, tricyclic antidepressants, phenytoin, phenobarbital or other medicines, foods, dyes, or preservatives -pregnant or trying to get pregnant -breast-feeding How should I use this medicine? Take this medicine by mouth with a glass of water. Follow the directions on the prescription label. Take this medicine with food. Take your doses at regular intervals. Do not take your medicine more often than directed. Do not stop taking this medicine except on the advice of your doctor or health care professional. A special MedGuide will be given to you by the pharmacist with each prescription and refill. Be sure to read this information carefully each time. Talk to your pediatrician regarding the use of this medicine in children. Special care may be needed. Overdosage: If you think you have taken too much of this medicine contact a poison control center or emergency room at once. NOTE: This medicine is only for you. Do not share this medicine with others. What if I miss a dose? If you miss a dose, take it as soon as you can. If it is almost time for your next dose, take only that dose. Do not take double or extra doses. What may interact with this  medicine? Do not take this medicine with any of the following medications: -delavirdine -MAOIs like Carbex, Eldepryl, Marplan, Nardil, and Parnate -nefazodone -oxcarbazepine This medicine may also interact with the following medications: -acetaminophen -acetazolamide -barbiturate medicines for inducing sleep or treating seizures, like phenobarbital -certain antibiotics like clarithromycin, erythromycin or  troleandomycin -cimetidine -cyclosporine -danazol -dicumarol -doxycycline -female hormones, including estrogens and birth control pills -grapefruit juice -isoniazid, INH -levothyroxine and other thyroid hormones -lithium and other medicines to treat mood problems or psychotic disturbances -loratadine -medicines for angina or high blood pressure -medicines for cancer -medicines for depression or anxiety -medicines for sleep -medicines to treat fungal infections, like fluconazole, itraconazole or ketoconazole -medicines used to treat HIV infection or AIDS -methadone -niacinamide -praziquantel -propoxyphene -rifampin or rifabutin -seizure or epilepsy medicine -steroid medicines such as prednisone or cortisone -theophylline -tramadol -warfarin This list may not describe all possible interactions. Give your health care provider a list of all the medicines, herbs, non-prescription drugs, or dietary supplements you use. Also tell them if you smoke, drink alcohol, or use illegal drugs. Some items may interact with your medicine. What should I watch for while using this medicine? Visit your doctor or health care professional for a regular check on your progress. Do not change brands or dosage forms of this medicine without discussing the change with your doctor or health care professional. If you are taking this medicine for epilepsy (seizures) do not stop taking it suddenly. This increases the risk of seizures. Wear a Arboriculturist or necklace. Carry an identification card with information about your condition, medications, and doctor or health care professional. Bonita Quin may get drowsy, dizzy, or have blurred vision. Do not drive, use machinery, or do anything that needs mental alertness until you know how this medicine affects you. To reduce dizzy or fainting spells, do not sit or stand up quickly, especially if you are an older patient. Alcohol can increase drowsiness and dizziness. Avoid  alcoholic drinks. Birth control pills may not work properly while you are taking this medicine. Talk to your doctor about using an extra method of birth control. This medicine can make you more sensitive to the sun. Keep out of the sun. If you cannot avoid being in the sun, wear protective clothing and use sunscreen. Do not use sun lamps or tanning beds/booths. The use of this medicine may increase the chance of suicidal thoughts or actions. Pay special attention to how you are responding while on this medicine. Any worsening of mood, or thoughts of suicide or dying should be reported to your health care professional right away. Women who become pregnant while using this medicine may enroll in the [place] Antiepileptic Drug Pregnancy Registry by calling [phone]. This registry collects information about the safety of antiepileptic drug use during pregnancy. What side effects may I notice from receiving this medicine? Side effects that you should report to your doctor or health care professional as soon as possible: -allergic reactions like skin rash, itching or hives, swelling of the face, lips, or tongue -breathing problems -changes in vision -confusion -dark urine -fast or irregular heartbeat -fever or chills, sore throat -mouth ulcers -pain or difficulty passing urine -redness, blistering, peeling or loosening of the skin, including inside the mouth -ringing in the ears -seizures -stomach pain -swollen joints or muscle/joint aches and pains -unusual bleeding or bruising -unusually weak or tired -vomiting -worsening of mood, thoughts or actions of suicide or dying -yellowing of the eyes or skin Side effects that usually do  not require medical attention (report to your doctor or health care professional if they continue or are bothersome): -clumsiness or unsteadiness -diarrhea or constipation -headache -increased sweating -nausea This list may not describe all possible  side effects. Call your doctor for medical advice about side effects. You may report side effects to FDA at 1-800-FDA-1088. Where should I keep my medicine? Keep out of reach of children. Store at room temperature below 30 degrees C (86 degrees F). Keep container tightly closed. Protect from moisture. Throw away any unused medicine after the expiration date. NOTE: This sheet is a summary. It may not cover all possible information. If you have questions about this medicine, talk to your doctor, pharmacist, or health care provider.  2015, Elsevier/Gold Standard. (2012-04-26 15:31:28)    Carbamazepine and levetiracetam a bolus antiseizure medications that can be used rule out pregnancy. However carbamazepine has a slightly higher risk of fetal malformation. It also affects hormonal treatments such as contraception and fertility treatments to a greater degree. Visit for that reason that I would like you to reduce the Tegretol or Carbatrol intake to 2 tablets in the morning and 2 tablets at night for 1 week and if well tolerated continued to take 1 tablet them in the morning and 2 at night. This may be the lowest level that you can tolerate for seizure control but it will make it easier to have a healthy pregnancy. In addition I have ordered Her for you 500 mg and an extended release form. He will have to take this medication once a day it is 24 hours active , take 1 tablet for the first week and the second week when you're going to reduce Carbatrol  you start to take 2 tablets at the same time, 1000 mg daily.  You can choose either morning or lunch or dinner intake time as you please.    I would like you to stay on both of these medications as discussed that you reached the second week of titration.   I will then have a revisit with you in 4 weeks to obtain blood levels. By that time he may decide to discontinue carbamazepine if tolerated and start Keppra 1500 mg daily as this sole treatment for your  seizures.

## 2014-12-03 ENCOUNTER — Telehealth: Payer: Self-pay | Admitting: Neurology

## 2014-12-03 NOTE — Telephone Encounter (Signed)
I called back.  Spoke with [employer].  Verified info.  They will proceed with Rx as prescribed and call back if anything further is needed.

## 2014-12-03 NOTE — Telephone Encounter (Signed)
Pharmacist Rosanne Ashing Cagle/[facility] [phone] called regarding TEGRETOL-XR 200 MG 12 hr tablet vs Clomiphene. Reference# [id number]. Please call by Wednesday 12/06/14

## 2014-12-31 ENCOUNTER — Ambulatory Visit (INDEPENDENT_AMBULATORY_CARE_PROVIDER_SITE_OTHER): Payer: BLUE CROSS/BLUE SHIELD | Admitting: Neurology

## 2014-12-31 ENCOUNTER — Encounter: Payer: Self-pay | Admitting: Neurology

## 2014-12-31 VITALS — BP 130/80 | HR 92 | Resp 20 | Ht 61.0 in | Wt 206.0 lb

## 2014-12-31 DIAGNOSIS — N979 Female infertility, unspecified: Secondary | ICD-10-CM | POA: Diagnosis not present

## 2014-12-31 DIAGNOSIS — G40209 Localization-related (focal) (partial) symptomatic epilepsy and epileptic syndromes with complex partial seizures, not intractable, without status epilepticus: Secondary | ICD-10-CM | POA: Diagnosis not present

## 2014-12-31 NOTE — Progress Notes (Signed)
PATIENT: Laurie Trujillo DOB: [birth year]  REASON FOR VISIT: follow up for complex partial seizures, 2 events in February and March 20 th 2016.  HISTORY FROM: patient  HISTORY OF PRESENT ILLNESS: 12/07/12 (CD): [name] was last seen on 12-02-11 after she had suffered a seizure or presumed to be a seizure on September 13th  2013. She described it as a feeling  Familiar to her before she was treated with carbamazepine.  She hasn't had a seizure or aura for many years but the seizures that have happened before were related to late medication intake times or to have forgotten to take medication. She also noted a trigger as possible sleep deprivation. She does not drink alcohol and she has had regular work hours and she was productive but allowed to drive after last visit with her. The patient has primary partial complex epilepsy and she has been very well controlled on the carbamazepine brand-name Tegretol. She's here today to get her refills. I gave her copies of our last visit notes and lab tests.  05/25/13 (LL): Patient here for regular followup, last visit 12/07/12.  She has not had any seizures since last visit, doing well.  Trying to conceive, but has not been successful yet.  No complaints.  Interval history 05-28-14, Laurie Trujillo had been seizure-free from October 2014 until February 2016 , when she suffered a seizure during her sleep, of which she was not aware.  Her husband witnessed the event and then another nocturnal seizure on March 20- 16,  last week. Both seizures ended without any self injury she did not have a tongue bite, there is no bruising noted. She did not become cyanotic or short any sign of physiologic distress. There was no associated bowel bladder or bowel incontinence. Her husband did not make any attempts to wake her and I'm not sure how long she may have been confused if there was such a possible postictal. She has noticed that both of these nocturnal spells happened after she  took her medication in a more irregular time. We discussed today of the should put her on an she's currently taking Tegretol-XR 200 mg 12 hour release form 2 in the morning 3 in the afternoon 12 hours apart. I would like to try to put the patient on a once a day Tegretol form. This will make her compliance easier. Patients with nocturnal seizure only are not refrained from driving.  We dicussed alcohol, sleep deprivation , flashing strobe lights as possible triggers.  Her last MRI of the brain was at [facility] neurology, under Dr. Jennet Maduro.    Interval history from 9-20 9-16. Since the patient was last seen in February  2016 she had 4 more seizures.  She is currently still on hormonal therapy with carbamazepine.  She has been compliant with the medication.  Complicating the issue is that she is going through fertility treatments locally with Dr. Fermin Schwab, MD. she is beginnin treatment in association with progesterone in sesame oil, Medrol tablets,  doxycycline 100 mg tablets and of the vivelle dot .patch 0.1 mg. She also takesMenopur injections. 0.25 milligrams for 7 days during that treatment cycle. She will then use beta  HCG in injectable form.  Progesterone has been suspected to decrease his she is a fresh old for this reason I would like for her to use Keppra as an associated drug,  that is not known to influence fertility and has no effect on hepatic or renal function.  I would  consider keeping her on her CBZ medication throughout pregnancy , but the drug is category C.  May use Keppra, We will not use phenytoin, topiramate, or Depakote for obvious reasons.   Interval history from 12-31-14,  Mrs. Laurie Trujillo is here for a visit as requested, She has successfully weaned off carbamazepine-Tegretol and has begun injection therapy with a fertility clinic. She is optimistic. She is not experiencing any seizures since her last visit a month ago. She's taking the 24 hour extended release form of  Keppra 500 mg 2 a day.   REVIEW OF SYSTEMS: Full 14 system review of systems performed and notable only for: No Complaints.  ALLERGIES: Allergies  Allergen Reactions  . Metformin And Related Nausea Only    HOME MEDICATIONS: Outpatient Prescriptions Prior to Visit  Medication Sig Dispense Refill  . folic acid (FOLVITE) 1 MG tablet TAKE 1 TABLET BY MOUTH EVERY DAY    . glipiZIDE (GLUCOTROL XL) 10 MG 24 hr tablet Take 10 mg by mouth.    . hydrochlorothiazide (HYDRODIURIL) 25 MG tablet TAKE 1 TABLET BY MOUTH DAILY.    Marland Kitchen. Insulin Degludec (TRESIBA FLEXTOUCH) 100 UNIT/ML SOPN INJECT TEN UNITS INTO THE SKIN DAILY.    Marland Kitchen. labetalol (NORMODYNE) 100 MG tablet Take 100 mg by mouth.    . levETIRAcetam (KEPPRA XR) 500 MG 24 hr tablet Take 1 tablet (500 mg total) by mouth daily. After 1 week increase to 1000mg  daily po. rv in 1 month 180 tablet 3  . Multiple Vitamin (MULTIVITAMIN) tablet Take 1 tablet by mouth daily. Women daily MVI    . Prenatal Vit-Fe Fumarate-FA (PRENATAL ONE DAILY) 27-0.8 MG TABS Take 1 tablet by mouth.    . TEGRETOL-XR 200 MG 12 hr tablet Reduce to 2 tab in AM and 2 tabs in Pm for 1 weeks, than  1 tab in am and 2 in PM. Rv in 1 month (Patient not taking: Reported on 12/31/2014) 75 tablet 1   No facility-administered medications prior to visit.     PHYSICAL EXAM  Filed Vitals:   12/31/14 0837  BP: 130/80  Pulse: 92  Resp: 20  Height: 5\' 1"  (1.549 m)  Weight: 206 lb (93.441 kg)   Body mass index is 38.94 kg/(m^2).  Generalized: Well developed, in no acute distress, obese female Head: normocephalic and atraumatic. Oropharynx benign , Mallampati 3 and neck circumference is 15 inches.  Neck: Supple, no carotid bruits  Cardiac: Regular rate rhythm, no murmur  Musculoskeletal: No deformity   Neurological examination  Mentation: Alert oriented to time, place, history taking. Follows all commands speech and language fluent Cranial nerve:  No change in taste or smell.  Fundoscopic exam not done. Pupils were equal round reactive to light extraocular movements were full, visual field were full on confrontational test. NO NYSTAGMUS.  Facial sensation and strength were normal. hearing was intact to finger rubbing bilaterally. Uvula tongue midline. head turning and shoulder shrug and were normal and symmetric.Tongue protrusion into cheek strength was normal. Shoulder shrug is symmetric.  Motor: The motor testing reveals 5 /5 strength of all 4 extremities. Good symmetric motor tone is noted throughout.  Sensory: Sensory testing is intact to pinprick, soft touch, vibration sensation, and position sense on all 4 extremities. No evidence of extinction is noted.  Coordination: Cerebellar testing reveals good finger-nose-finger and heel-to-shin bilaterally.  Gait and station: Gait is normal. Tandem gait is normal. Romberg is negative. No drift is seen.  Reflexes: Deep tendon reflexes are symmetric and normal  bilaterally.   ASSESSMENT AND PLAN 39 y.o. year old female seen in  routine follow up for complex partial seizures and secondary generalization. Additional questions of fertility treatment chances affected by ant epileptic medications. And pregnancy related medication side effects.   Had to wean off Carbemazepine.  Added keppra 500  mg at night , 2 at night   The patient is enrolled in fertility treatements , IVF  .  Begun injections. Folow up with Dr Rhae Hammock today.  We need to revist the issue of 12 hour Tegretol versus a different medication for pregnancy safety. The Tegretol category was  not pefered for pregnancy,   All the seizures have been nocturnal. Changed to Keppra . She has not noticed any mood changes coming off Tegretol and going on Keppra, noticed no depression, blue feeling, nor  lack of energy.  I will also enlist the patient once pregnant into the [facility] pregnancy and seizure medication study as soon as the IVF treatment begins..  I will provide a  printed handout about pregnancy and safety of antiepileptic drugs to the patient.  I added Keppra 250 mg at night , to be increased to 500 mg if tolerated at night only.  Today increased to 100 mg daily , 2 of 500 mg .  Visit duration of 30 minutes and discussion of pregnancy risk in epilepsy and on epileptic medication, it  took 15 plus minutes of the face to face time.   The patient has tolerated Keppra very well, she should be able to remain for the rest of her pregnancy on thousand milligrams a day since this is a 24 hour release form she can take both tablets at night or in the morning she does not have to split them up into doses. This usually helps with compliance. There is no tremor noted, no rigidity noted nystagmus and no ataxia. I wish Mrs. Renton the best for the plantar pregnancy and reports that she successful. I will share my notes with Dr. Rhae Hammock.    Meds ordered this encounter  Medications    Keppra 500 mg two a day, 24 hour release form.  No labs today- all done through fertitily clinic, I will ask Dr. Jeannie Fend to share the results with me.    Melvyn Novas, MD   12/31/2014, 8:39 AM [facility] [address] [place], [place] [address] [phone]

## 2014-12-31 NOTE — Patient Instructions (Addendum)
Levetiracetam extended-release tablets What is this medicine? LEVETIRACETAM (lee ve tye RA se tam) is an antiepileptic drug. It is used with other medicines to treat certain types of seizures. This medicine may be used for other purposes; ask your health care provider or pharmacist if you have questions. What should I tell my health care provider before I take this medicine? They need to know if you have any of these conditions: -kidney disease -suicidal thoughts, plans, or attempt; a previous suicide attempt by you or a family member -an unusual or allergic reaction to levetiracetam, other medicines, foods, dyes, or preservatives -pregnant or trying to get pregnant -breast-feeding How should I use this medicine? Take this medicine by mouth with a glass of water. Follow the directions on the prescription label. Do not cut, crush or chew this medicine. You may take this medicine with or without food. Take your doses at regular intervals. Do not take your medicine more often than directed. Do not stop taking this medicine or any of your seizure medicines unless instructed by your doctor or health care professional. Stopping your medicine suddenly can increase your seizures or their severity. A special MedGuide will be given to you by the pharmacist with each prescription and refill. Be sure to read this information carefully each time. Contact your pediatrician or health care professional regarding the use of this medication in children. While this drug may be prescribed for children as young as 40 years of age for selected conditions, precautions do apply. Overdosage: If you think you have taken too much of this medicine contact a poison control center or emergency room at once. NOTE: This medicine is only for you. Do not share this medicine with others. What if I miss a dose? If you miss a dose and it has only been a few hours, take it as soon as you can. If it is almost time for your next dose,  take only that dose. Do not take double or extra doses. What may interact with this medicine? This medicine may interact with the following medications: -carbamazepine -colesevelam -probenecid -sevelamer This list may not describe all possible interactions. Give your health care provider a list of all the medicines, herbs, non-prescription drugs, or dietary supplements you use. Also tell them if you smoke, drink alcohol, or use illegal drugs. Some items may interact with your medicine. What should I watch for while using this medicine? Visit your doctor or health care professional for a regular check on your progress. Wear a medical identification bracelet or chain to say you have epilepsy, and carry a card that lists all your medications. It is important to take this medicine exactly as instructed by your health care professional. When first starting treatment, your dose may need to be adjusted. It may take weeks or months before your dose is stable. You should contact your doctor or health care professional if your seizures get worse or if you have any new types of seizures. You may get drowsy or dizzy. Do not drive, use machinery, or do anything that needs mental alertness until you know how this medicine affects you. Do not stand or sit up quickly, especially if you are an older patient. This reduces the risk of dizzy or fainting spells. Alcohol may interfere with the effect of this medicine. Avoid alcoholic drinks. The use of this medicine may increase the chance of suicidal thoughts or actions. Pay special attention to how you are responding while on this medicine. Any worsening of mood,  or thoughts of suicide or dying should be reported to your health care professional right away. The tablet shell for some brands of this medicine does not dissolve. This is normal. The tablet shell may appear in the stool. This is not cause for concern. Women who become pregnant while using this medicine may  enroll in the [place] Antiepileptic Drug Pregnancy Registry by calling [phone]. This registry collects information about the safety of antiepileptic drug use during pregnancy. What side effects may I notice from receiving this medicine? Side effects you should report to your doctor or health care professional as soon as possible: -allergic reactions like skin rash, itching or hives, swelling of the face, lips, or tongue -breathing problems -changes in emotions or moods -dark urine -general ill feeling or flu-like symptoms -problems with balance, talking, walking -suicidal thoughts or actions -unusually weak or tired -yellowing of the eyes or skin Side effects that usually do not require medical attention (report to your doctor or health care professional if they continue or are bothersome): -diarrhea -dizzy, drowsy -headache -loss of appetite This list may not describe all possible side effects. Call your doctor for medical advice about side effects. You may report side effects to FDA at 1-800-FDA-1088. Where should I keep my medicine? Keep out of reach of children. Store at room temperature between 15 and 30 degrees C (59 and 86 degrees F). Throw away any unused medicine after the expiration date. NOTE: This sheet is a summary. It may not cover all possible information. If you have questions about this medicine, talk to your doctor, pharmacist, or health care provider.    2016, Elsevier/Gold Standard. (2014-06-11 10:13:38)     RV in 3 month with either NP or me, Labs from Dr. Rhae HammockYasankaya requested. Fax to [phone] Attention To Dr. Vickey Hugerohmeier.

## 2015-01-28 ENCOUNTER — Encounter: Payer: Self-pay | Admitting: Neurology

## 2015-02-28 ENCOUNTER — Ambulatory Visit (HOSPITAL_COMMUNITY)
Admission: RE | Admit: 2015-02-28 | Discharge: 2015-02-28 | Disposition: A | Discharge status: Home / Self Care | Payer: BLUE CROSS/BLUE SHIELD | Source: Ambulatory Visit | Attending: Obstetrics and Gynecology | Admitting: Obstetrics and Gynecology

## 2015-02-28 ENCOUNTER — Encounter: Payer: BLUE CROSS/BLUE SHIELD | Attending: Neurology | Admitting: *Deleted

## 2015-02-28 DIAGNOSIS — Z713 Dietary counseling and surveillance: Secondary | ICD-10-CM | POA: Diagnosis not present

## 2015-02-28 DIAGNOSIS — O24111 Pre-existing diabetes mellitus, type 2, in pregnancy, first trimester: Secondary | ICD-10-CM | POA: Insufficient documentation

## 2015-02-28 NOTE — Addendum Note (Signed)
Encounter addended by: Rosendo GrosNancy L Vere Diantonio, RN on: 02/28/2015  4:34 PM<BR>     Documentation filed: Medications

## 2015-02-28 NOTE — Progress Notes (Signed)
  Patient was seen on 02/28/15 for Diabetes self-management [id number] complicated by Pregnancy . Patient has had T2DM for approximately 5 years. Her present management regimen is Glipizide XL '10mg'$  AM and Tresiba 48units at HS. Laurie Trujillo notes that her current FBS range 80-'90mg'$ /dl and 2hpp dinner 170-200+mg/dl.  In consult with Dr. Renella Cunas we will discontinue all existing diabetes medications as noted.  New regimen: Humalin N 21units with breakfast and 21units at bed. Humalog 7 units 15 minutes prior to each meal. I will follow up with Laurie Trujillo on Monday 03/04/15 via telephone for evaluation of glucose readings and make modifications as needed. She is scheduled for MD consultation Friday March 08, 2015 at 4:00pm  The following learning objectives were met by the patient :   States why dietary management is important in controlling blood glucose  Describes the effects of carbohydrates on blood glucose levels  Demonstrates ability to create a balanced meal plan  Demonstrates carbohydrate counting   States when to check blood glucose levels  Demonstrates proper blood glucose monitoring techniques  States the effect of stress and exercise on blood glucose levels  States the importance of limiting caffeine and abstaining from alcohol and smoking  Plan:  Aim for 2 Carb Choices per meal (30 grams) +/- 1 either way for breakfast Aim for 3 Carb Choices per meal (45 grams) +/- 1 either way from lunch and dinner Aim for 1-2 Carbs per snack Begin reading food labels for Total Carbohydrate and sugar grams of foods Consider  increasing your activity level by walking daily as tolerated Begin checking BG before breakfast and 2 hours after first bit of breakfast, lunch and dinner after  as directed by MD  Take medication  as directed by MD  Blood glucose reading today 2hpp: '187mg'$ /dl  Patient instructed to monitor glucose levels: FBS: 60 - <90 2 hour: <120  Patient received the following  handouts:  Carbohydrate Counting List  Meal Planning worksheet

## 2015-03-01 ENCOUNTER — Other Ambulatory Visit (HOSPITAL_COMMUNITY): Payer: Self-pay

## 2015-03-01 ENCOUNTER — Encounter (HOSPITAL_COMMUNITY): Payer: Self-pay

## 2015-03-06 LAB — OB RESULTS CONSOLE ANTIBODY SCREEN: Antibody Screen: NEGATIVE

## 2015-03-06 LAB — OB RESULTS CONSOLE ABO/RH: RH Type: POSITIVE

## 2015-03-06 LAB — OB RESULTS CONSOLE HIV ANTIBODY (ROUTINE TESTING): HIV: NONREACTIVE

## 2015-03-06 LAB — OB RESULTS CONSOLE HEPATITIS B SURFACE ANTIGEN: Hepatitis B Surface Ag: NEGATIVE

## 2015-03-06 LAB — OB RESULTS CONSOLE RPR: RPR: NONREACTIVE

## 2015-03-06 LAB — OB RESULTS CONSOLE GC/CHLAMYDIA
CHLAMYDIA, DNA PROBE: NEGATIVE
GC PROBE AMP, GENITAL: NEGATIVE

## 2015-03-06 LAB — OB RESULTS CONSOLE RUBELLA ANTIBODY, IGM: Rubella: IMMUNE

## 2015-03-08 ENCOUNTER — Ambulatory Visit (HOSPITAL_COMMUNITY)
Admission: RE | Admit: 2015-03-08 | Discharge: 2015-03-08 | Disposition: A | Discharge status: Home / Self Care | Payer: BLUE CROSS/BLUE SHIELD | Source: Ambulatory Visit | Attending: Obstetrics and Gynecology | Admitting: Obstetrics and Gynecology

## 2015-03-08 ENCOUNTER — Encounter (HOSPITAL_COMMUNITY): Payer: Self-pay

## 2015-03-08 DIAGNOSIS — O24912 Unspecified diabetes mellitus in pregnancy, second trimester: Secondary | ICD-10-CM | POA: Insufficient documentation

## 2015-03-08 DIAGNOSIS — G40209 Localization-related (focal) (partial) symptomatic epilepsy and epileptic syndromes with complex partial seizures, not intractable, without status epilepticus: Secondary | ICD-10-CM | POA: Insufficient documentation

## 2015-03-08 DIAGNOSIS — O99352 Diseases of the nervous system complicating pregnancy, second trimester: Secondary | ICD-10-CM | POA: Insufficient documentation

## 2015-03-17 ENCOUNTER — Encounter (HOSPITAL_COMMUNITY): Payer: Self-pay | Admitting: *Deleted

## 2015-03-17 NOTE — Progress Notes (Unsigned)
Late documentation of phone call 11/13/15 10:57 - 11:18AM:  Patient phoned into [facility] with review of glucose readings. Patient present insulin regimen NPH 24uAM -NPH 24PM. FBS range 86-96, 2hpp breakfast 87-183, 2hpp lunch 100-193mg /dl, 2hpp dinner [phone]. In discussion with patient she has been drinking orange juice daily along with using "Emerg C"  Water additive. Both of which are high in glucose. She has also been eating Honey Nut Cereal and fruit. I have discouraged patient from consuming any type of fruit juice, NO Emerg C, only cereal recommended is 1C Regular Cheerios and 1C Special K Protein. Recommend insulin increase: NPH AM to 28units, Evening NPH remain 24, Increase Meal coverage of Regular to 10units per meal. However if she finds that the 10 units of meal coverage is bringing her glucose too low to return to 7units. I will call patient on Monday  03/18/15 to review glucose readings.

## 2015-03-18 ENCOUNTER — Telehealth (HOSPITAL_COMMUNITY): Payer: Self-pay | Admitting: *Deleted

## 2015-04-04 ENCOUNTER — Ambulatory Visit: Payer: BLUE CROSS/BLUE SHIELD | Admitting: Neurology

## 2015-04-16 ENCOUNTER — Ambulatory Visit (INDEPENDENT_AMBULATORY_CARE_PROVIDER_SITE_OTHER): Payer: BLUE CROSS/BLUE SHIELD | Admitting: Neurology

## 2015-04-16 ENCOUNTER — Encounter: Payer: Self-pay | Admitting: Neurology

## 2015-04-16 VITALS — BP 150/72 | HR 94 | Resp 20 | Ht 61.0 in | Wt 203.0 lb

## 2015-04-16 DIAGNOSIS — G40209 Localization-related (focal) (partial) symptomatic epilepsy and epileptic syndromes with complex partial seizures, not intractable, without status epilepticus: Secondary | ICD-10-CM

## 2015-04-16 DIAGNOSIS — Z5181 Encounter for therapeutic drug level monitoring: Secondary | ICD-10-CM | POA: Diagnosis not present

## 2015-04-16 DIAGNOSIS — O24912 Unspecified diabetes mellitus in pregnancy, second trimester: Secondary | ICD-10-CM | POA: Diagnosis not present

## 2015-04-16 DIAGNOSIS — O24919 Unspecified diabetes mellitus in pregnancy, unspecified trimester: Secondary | ICD-10-CM | POA: Insufficient documentation

## 2015-04-16 MED ORDER — LEVETIRACETAM 750 MG PO TABS
750.0000 mg | ORAL_TABLET | Freq: Two times a day (BID) | ORAL | Status: DC
Start: 2015-04-16 — End: 2015-10-15

## 2015-04-16 NOTE — Patient Instructions (Addendum)
SUDEP discussion, Keppra increased to 750 mg bid po, 12 hours apart. Rv in 8-10 weeks. Labs through Dr. Vincente Poli. Levetiracetam tablets What is this medicine? LEVETIRACETAM (lee ve tye RA se tam) is an antiepileptic drug. It is used with other medicines to treat certain types of seizures. This medicine may be used for other purposes; ask your health care provider or pharmacist if you have questions. What should I tell my health care provider before I take this medicine? They need to know if you have any of these conditions: -kidney disease -suicidal thoughts, plans, or attempt; a previous suicide attempt by you or a family member -an unusual or allergic reaction to levetiracetam, other medicines, foods, dyes, or preservatives -pregnant or trying to get pregnant -breast-feeding How should I use this medicine? Take this medicine by mouth with a glass of water. Follow the directions on the prescription label. Swallow the tablets whole. Do not crush or chew this medicine. You may take this medicine with or without food. Take your doses at regular intervals. Do not take your medicine more often than directed. Do not stop taking this medicine or any of your seizure medicines unless instructed by your doctor or health care professional. Stopping your medicine suddenly can increase your seizures or their severity. A special MedGuide will be given to you by the pharmacist with each prescription and refill. Be sure to read this information carefully each time. Contact your pediatrician or health care professional regarding the use of this medication in children. While this drug may be prescribed for children as young as 76 years of age for selected conditions, precautions do apply. Overdosage: If you think you have taken too much of this medicine contact a poison control center or emergency room at once. NOTE: This medicine is only for you. Do not share this medicine with others. What if I miss a dose? If you  miss a dose, take it as soon as you can. If it is almost time for your next dose, take only that dose. Do not take double or extra doses. What may interact with this medicine? This medicine may interact with the following medications: -carbamazepine -colesevelam -probenecid -sevelamer This list may not describe all possible interactions. Give your health care provider a list of all the medicines, herbs, non-prescription drugs, or dietary supplements you use. Also tell them if you smoke, drink alcohol, or use illegal drugs. Some items may interact with your medicine. What should I watch for while using this medicine? Visit your doctor or health care professional for a regular check on your progress. Wear a medical identification bracelet or chain to say you have epilepsy, and carry a card that lists all your medications. It is important to take this medicine exactly as instructed by your health care professional. When first starting treatment, your dose may need to be adjusted. It may take weeks or months before your dose is stable. You should contact your doctor or health care professional if your seizures get worse or if you have any new types of seizures. You may get drowsy or dizzy. Do not drive, use machinery, or do anything that needs mental alertness until you know how this medicine affects you. Do not stand or sit up quickly, especially if you are an older patient. This reduces the risk of dizzy or fainting spells. Alcohol may interfere with the effect of this medicine. Avoid alcoholic drinks. The use of this medicine may increase the chance of suicidal thoughts or actions. Pay special  attention to how you are responding while on this medicine. Any worsening of mood, or thoughts of suicide or dying should be reported to your health care professional right away. Women who become pregnant while using this medicine may enroll in the [place] Antiepileptic Drug Pregnancy Registry by calling  [phone]. This registry collects information about the safety of antiepileptic drug use during pregnancy. What side effects may I notice from receiving this medicine? Side effects you should report to your doctor or health care professional as soon as possible: -allergic reactions like skin rash, itching or hives, swelling of the face, lips, or tongue -breathing problems -dark urine -general ill feeling or flu-like symptoms -problems with balance, talking, walking -unusually weak or tired -worsening of mood, thoughts or actions of suicide or dying -yellowing of the eyes or skin Side effects that usually do not require medical attention (report to your doctor or health care professional if they continue or are bothersome): -diarrhea -dizzy, drowsy -headache -loss of appetite This list may not describe all possible side effects. Call your doctor for medical advice about side effects. You may report side effects to FDA at 1-800-FDA-1088. Where should I keep my medicine? Keep out of reach of children. Store at room temperature between 15 and 30 degrees C (59 and 86 degrees F). Throw away any unused medicine after the expiration date. NOTE: This sheet is a summary. It may not cover all possible information. If you have questions about this medicine, talk to your doctor, pharmacist, or health care provider.    2016, Elsevier/Gold Standard. (2013-01-10 08:42:48)

## 2015-04-16 NOTE — Progress Notes (Signed)
PATIENT: Laurie Trujillo DOB: 1975-03-12  REASON FOR VISIT: follow up for complex partial seizures, 2 events in February and March 20 th 2016.  HISTORY FROM: patient  HISTORY OF PRESENT ILLNESS: 12/07/12 (CD): Ms. Cullop was last seen on 12-02-11 after she had suffered a seizure or presumed to be a seizure on September 13th, 2013. She described it as a feeling familiar to her before she was treated with carbamazepine. She hasn't had a seizure or aura for many years- but the seizures that have happened before were related to late medication intake times or skipped doses.  She also noted a trigger of sleep deprivation. She does not drink alcohol and she has had regular work hours and she was productive but allowed to drive after last visit with her. The patient has primary partial complex epilepsy and she has been very well controlled on the carbamazepine brand-name Tegretol. She's here today to get her refills. I gave her copies of our last visit notes and lab tests.  05/25/13 (LL): Patient here for regular followup, last visit 12/07/12.  She has not had any seizures since last visit, doing well.  Trying to conceive, but has not been successful yet.  No complaints.  Interval history 05-28-14, Mrs. Dann had been seizure-free from October 2014 until February 2016 , when she suffered a seizure during her sleep, of which she was not aware.  Her husband witnessed the event and then another nocturnal seizure on March 20- 16,  last week. Both seizures ended without any self injury she did not have a tongue bite, there is no bruising noted. She did not become cyanotic or short any sign of physiologic distress. There was no associated bowel bladder or bowel incontinence. Her husband did not make any attempts to wake her and I'm not sure how long she may have been confused if there was such a possible postictal. She has noticed that both of these nocturnal spells happened after she took her medication in a more  irregular time. We discussed today of the should put her on an she's currently taking Tegretol-XR 200 mg 12 hour release form 2 in the morning 3 in the afternoon 12 hours apart. I would like to try to put the patient on a once a day Tegretol form. This will make her compliance easier. Patients with nocturnal seizure only are not refrained from driving.  We dicussed alcohol, sleep deprivation , flashing strobe lights as possible triggers.  Her last MRI of the brain was at [facility] neurology, under Dr. Jennet Maduro.    Interval history from 11-29-14. Since the patient was last seen in February  2016 she had 4 more seizures.  She is currently still on hormonal therapy with carbamazepine.  She has been compliant with the medication.  Complicating the issue is that she is going through fertility treatments locally with Dr. Fermin Schwab, MD. she is beginnin treatment in association with progesterone in sesame oil, Medrol tablets,  doxycycline 100 mg tablets and of the vivelle dot .patch 0.1 mg. She also takesMenopur injections. 0.25 milligrams for 7 days during that treatment cycle. She will then use beta  HCG in injectable form.  Progesterone has been suspected to decrease his she is a fresh old for this reason I would like for her to use Keppra as an associated drug,  that is not known to influence fertility and has no effect on hepatic or renal function.  I would consider keeping her on her CBZ medication throughout  pregnancy , but the drug is category C.  May use Keppra, We will not use phenytoin, topiramate, or Depakote for obvious reasons.   Interval history from 12-31-14,  Mrs. Gomes is here for a visit as requested, She has successfully weaned off carbamazepine-Tegretol and has begun injection therapy with a fertility clinic. She is optimistic. She is not experiencing any seizures since her last visit a month ago. She's taking the 24 hour extended release form of Keppra 500 mg 2 a day.  Interval  history from the 14th of February 2017, Mrs. Scarfo is pregnant with her first child she has now reached [redacted] weeks gestational pregnancy. She had no seizures since the beginning of the pregnancy has no longer taking Tegretol and is currently controlled on Keppra only. The patient was advised of SUDEP and we decided to increase the Keppra to 750 mg bid po. I will start this with her next refill.  SUDEP phenomenon is a higher risk  of sudden unexplained death in epileptics, and e affects the least well controlled seizure patients in Disproportion. She is using insulin and no longer oral Glipizide.    REVIEW OF SYSTEMS: Full 14 system review of systems performed and notable only for: No Complaints.  ALLERGIES: Allergies  Allergen Reactions  . Metformin And Related Nausea Only    HOME MEDICATIONS: Outpatient Prescriptions Prior to Visit  Medication Sig Dispense Refill  . folic acid (FOLVITE) 1 MG tablet TAKE 1 TABLET BY MOUTH EVERY DAY    . Insulin Degludec (TRESIBA FLEXTOUCH) 100 UNIT/ML SOPN Reported on 02/28/2015    . insulin lispro (HUMALOG) 100 UNIT/ML injection Inject 7 Units into the skin 3 (three) times daily with meals.    . insulin NPH Human (HUMULIN N,NOVOLIN N) 100 UNIT/ML injection Inject 21 Units into the skin 2 (two) times daily at 8 am and 10 pm.    . labetalol (NORMODYNE) 100 MG tablet Take 100 mg by mouth.    . levETIRAcetam (KEPPRA XR) 500 MG 24 hr tablet Take 1 tablet (500 mg total) by mouth daily. After 1 week increase to  daily po. rv in 1 month 180 tablet 3  . Multiple Vitamin (MULTIVITAMIN) tablet Take 1 tablet by mouth daily. Women daily MVI    . Prenatal Vit-Fe Fumarate-FA (PRENATAL ONE DAILY) 27-0.8 MG TABS Take 1 tablet by mouth.    Marland Kitchen glipiZIDE (GLUCOTROL XL) 10 MG 24 hr tablet Take 10 mg by mouth. Reported on 02/28/2015    . hydrochlorothiazide (HYDRODIURIL) 25 MG tablet TAKE 1 TABLET BY MOUTH DAILY.     No facility-administered medications prior to visit.       PHYSICAL EXAM  Filed Vitals:   04/16/15 1322  BP: 150/72  Pulse: 94  Resp: 20  Height:  (1.549 m)  Weight: 203 lb (92.08 kg)   Body mass index is 38.38 kg/(m^2).  Generalized: Well developed, in no acute distress, obese female Head: normocephalic and atraumatic. Oropharynx benign , Mallampati 3 and neck circumference is 15 inches.  Neck: Supple, no carotid bruits  Cardiac: Regular rate rhythm, no murmur  Musculoskeletal: No deformity   Neurological examination  Mentation: Alert oriented to time, place, history taking. Follows all commands speech and language fluent Cranial nerve:  No change in taste or smell. Fundoscopic exam not done. Pupils were equal round reactive to light extraocular movements were full, visual field were full on confrontational test. NO NYSTAGMUS.  Facial sensation and strength were normal. hearing was intact to finger rubbing  bilaterally. Uvula tongue midline. head turning and shoulder shrug and were normal and symmetric.Tongue protrusion into cheek strength was normal. Shoulder shrug is symmetric.  Motor: The motor testing reveals 5 /5 strength of all 4 extremities. Good symmetric motor tone is noted throughout.  Sensory: Sensory testing is intact to pinprick, soft touch, vibration sensation, and position sense on all 4 extremities. No evidence of extinction is noted.  Coordination: Cerebellar testing reveals good finger-nose-finger and heel-to-shin bilaterally.  Gait and station: Gait is normal. Tandem gait is normal. Romberg is negative. No drift is seen.  Reflexes: Deep tendon reflexes are symmetric and normal bilaterally.   ASSESSMENT AND PLAN 40 y.o. year old female seen in  routine follow up for complex partial seizures and secondary generalization.   Excellently controlled on Keppra alone, with the intend to day to increase to 750 mg bid po.  SUDEP education.   Additional questions of fertility treatment chances affected by anti- epileptic  medications.  Pregnancy related questions about  Epilepsy medication , side effects/ teratogenicity.     I will provide a printed handout about pregnancy and safety of antiepileptic drugs to the patient. Visit duration of 25 minutes and discussion of pregnancy risk in epilepsy and on epileptic medication, it  took 15 plus minutes of the face to face time.  There is no tremor noted, no rigidity noted nystagmus and no ataxia. I wish Mrs. Nasby the best for her pregnancy and will share my notes with Marcelle Overlie, MD   Meds ordered this encounter  Medications   Keppra 750 mg two a day, 24 hour release form.  This is still a low dose and Keppra is considered safer in pregnancy.   No labs today- all done through fertitily and pregnancy - clinic, I will ask Dr. Rhae Hammock and Dr. Marcelle Overlie, MD,  to share the results with me.  Rv in 8 weeks, with NP.   Melvyn Novas, MD   04/16/2015, 1:41 PM [facility] [address] [place], [place] [address] [phone]

## 2015-04-17 ENCOUNTER — Telehealth: Payer: Self-pay

## 2015-04-17 NOTE — Telephone Encounter (Signed)
Dr. Vickey Huger asked me to register pt with the pregnancy registry at [facility] for epilepsy patients. I called pt and gave her the web address and asked her to register. Pt verbalized understanding.   [email or web address]

## 2015-04-25 ENCOUNTER — Other Ambulatory Visit (HOSPITAL_COMMUNITY): Payer: Self-pay | Admitting: Obstetrics and Gynecology

## 2015-04-25 DIAGNOSIS — Z3689 Encounter for other specified antenatal screening: Secondary | ICD-10-CM

## 2015-04-25 DIAGNOSIS — E119 Type 2 diabetes mellitus without complications: Secondary | ICD-10-CM

## 2015-04-25 DIAGNOSIS — IMO0001 Reserved for inherently not codable concepts without codable children: Secondary | ICD-10-CM

## 2015-04-25 DIAGNOSIS — O09522 Supervision of elderly multigravida, second trimester: Secondary | ICD-10-CM

## 2015-04-25 DIAGNOSIS — Z794 Long term (current) use of insulin: Secondary | ICD-10-CM

## 2015-04-25 DIAGNOSIS — G40909 Epilepsy, unspecified, not intractable, without status epilepticus: Secondary | ICD-10-CM

## 2015-04-25 DIAGNOSIS — Z3A18 18 weeks gestation of pregnancy: Secondary | ICD-10-CM

## 2015-04-25 DIAGNOSIS — O99352 Diseases of the nervous system complicating pregnancy, second trimester: Secondary | ICD-10-CM

## 2015-05-07 ENCOUNTER — Encounter (HOSPITAL_COMMUNITY): Payer: Self-pay

## 2015-05-07 ENCOUNTER — Ambulatory Visit (HOSPITAL_COMMUNITY): Admission: RE | Admit: 2015-05-07 | Payer: BLUE CROSS/BLUE SHIELD | Source: Ambulatory Visit

## 2015-05-07 ENCOUNTER — Ambulatory Visit (HOSPITAL_COMMUNITY)
Admission: RE | Admit: 2015-05-07 | Discharge: 2015-05-07 | Disposition: A | Discharge status: Home / Self Care | Payer: BLUE CROSS/BLUE SHIELD | Source: Ambulatory Visit | Attending: Obstetrics and Gynecology | Admitting: Obstetrics and Gynecology

## 2015-05-07 VITALS — BP 150/63 | HR 87 | Wt 207.2 lb

## 2015-05-07 DIAGNOSIS — O99352 Diseases of the nervous system complicating pregnancy, second trimester: Secondary | ICD-10-CM | POA: Diagnosis not present

## 2015-05-07 DIAGNOSIS — Z794 Long term (current) use of insulin: Secondary | ICD-10-CM | POA: Insufficient documentation

## 2015-05-07 DIAGNOSIS — O10012 Pre-existing essential hypertension complicating pregnancy, second trimester: Secondary | ICD-10-CM | POA: Diagnosis not present

## 2015-05-07 DIAGNOSIS — O24112 Pre-existing diabetes mellitus, type 2, in pregnancy, second trimester: Secondary | ICD-10-CM | POA: Insufficient documentation

## 2015-05-07 DIAGNOSIS — G40909 Epilepsy, unspecified, not intractable, without status epilepticus: Secondary | ICD-10-CM

## 2015-05-07 DIAGNOSIS — O09512 Supervision of elderly primigravida, second trimester: Secondary | ICD-10-CM | POA: Insufficient documentation

## 2015-05-07 DIAGNOSIS — E119 Type 2 diabetes mellitus without complications: Secondary | ICD-10-CM | POA: Diagnosis not present

## 2015-05-07 DIAGNOSIS — Z3A18 18 weeks gestation of pregnancy: Secondary | ICD-10-CM | POA: Diagnosis not present

## 2015-05-07 DIAGNOSIS — O09519 Supervision of elderly primigravida, unspecified trimester: Secondary | ICD-10-CM | POA: Insufficient documentation

## 2015-05-07 DIAGNOSIS — Z315 Encounter for genetic counseling: Secondary | ICD-10-CM | POA: Diagnosis not present

## 2015-05-07 DIAGNOSIS — Z3689 Encounter for other specified antenatal screening: Secondary | ICD-10-CM

## 2015-05-07 DIAGNOSIS — O24919 Unspecified diabetes mellitus in pregnancy, unspecified trimester: Secondary | ICD-10-CM

## 2015-05-07 DIAGNOSIS — IMO0001 Reserved for inherently not codable concepts without codable children: Secondary | ICD-10-CM

## 2015-05-07 DIAGNOSIS — O09522 Supervision of elderly multigravida, second trimester: Secondary | ICD-10-CM

## 2015-05-07 HISTORY — DX: Essential (primary) hypertension: I10

## 2015-05-07 HISTORY — DX: Type 2 diabetes mellitus without complications: E11.9

## 2015-05-07 NOTE — Progress Notes (Signed)
Genetic Counseling  High-Risk Gestation Note  Appointment Date:  05/07/2015 Referred By: Marcelle Overlie, MD Date of Birth:  May 09, 1975 Partner:  [name]   Pregnancy History: G1P0 Estimated Date of Delivery: 10/05/15 Estimated Gestational Age: [redacted]w[redacted]d Attending: Eulis Foster, MD   Laurie Trujillo and her husband, Laurie Trujillo, were seen for genetic counseling because of a maternal age of 40 y.o. and two previous noninvasive prenatal screening samples in pregnancy with low fetal fraction. The current pregnancy was conceived via IVF.   In Summary:   Discussed maternal age related risk for fetal aneuploidy  Previous attempt at NIPS did not yield results due to low fetal fraction in first trimester  Reviewed this can be impacted by various factors including maternal BMI, gestation in pregnancy, maternal physiology  Low fetal fraction also reported to have association with slightly increased risk for fetal aneuploidy  Detailed ultrasound performed today; Visualized fetal anatomy within normal limits; Echogenic intracardiac focus visualized  Reviewed EIF as soft marker for fetal aneuploidy and associated adjustment for fetal Down syndrome to approximately 1 in 62 (1.6%)  Reviewed screening option of Quad screen (assess for ONTDs, Down syndrome, Trisomy 18) and diagnostic option of amniocentesis  Patient declined Quad screen, given her concern that risk assessment from Quad could potentially cause more worry in pregnancy for her  Declined amniocentesis given associated risk for complications  Fetal echo is reportedly scheduled with Dr. Elizebeth Brooking to maternal diabetes  Follow-up ultrasound scheduled in 4 weeks  MFM consult today given patient's personal history of seizures and diabetes; See separate MFM consult note    They were counseled regarding maternal age and the association with risk for chromosome conditions due to nondisjunction with aging of the ova.   We reviewed  chromosomes, nondisjunction, and the associated 1 in 69 risk for fetal aneuploidy related to a maternal age of 40 y.o. at [redacted]w[redacted]d gestation.  They were counseled that the risk for aneuploidy decreases as gestational age increases, accounting for those pregnancies which spontaneously abort.  We specifically discussed Down syndrome (trisomy 22), trisomies 22 and 21, and sex chromosome aneuploidies (47,XXX and 47,XXY) including the common features and prognoses of each.   Laurie Trujillo previously had two noninvasive prenatal screening (NIPS)/cell free DNA testing attempts, [facility] through [facility] laboratory which did not yield results due to low fetal fraction. We reviewed the methodology of NIPS and the conditions for which it screens. We discussed that "fetal fraction" is a term used to describe the percentage of cell free DNA in maternal serum which originates from the pregnancy, and that specifically the source of the cell free DNA from the pregnancy is the placenta. We discussed that lower fetal fraction can be related to increased maternal BMI, differences in physiology and underlying fetal aneuploidy. Regarding the increased risk for fetal aneuploidy related to lower fetal fraction in NIPS sample, specifically risks for conditions trisomy 73, trisomy 34 and triploidy have been described.   We reviewed additional available screening options including Quad screen and detailed ultrasound.  They were counseled that screening tests are used to modify a patient's a priori risk for aneuploidy, typically based on age. This estimate provides a pregnancy specific risk assessment. We reviewed the benefits and limitations of each option. Specifically, we discussed the conditions for which each test screens, the detection rates, and false positive rates of each. They were also counseled regarding diagnostic testing via amniocentesis. We reviewed the approximate 1 in 300-500 risk for complications for amniocentesis, including  spontaneous pregnancy loss.   Detailed ultrasound was performed today. Echogenic intracardiac focus visualized today. Remaining visualized fetal anatomy within normal limits today. Complete ultrasound results reported separately. An isolated echogenic focus is generally believed to be a normal variation without any concerns for the pregnancy.  Isolated echogenic cardiac foci are not associated with congenital heart defects in the baby or compromised cardiac function after birth.  However, an echogenic cardiac focus is associated with a slightly increased chance for Down syndrome in the pregnancy. Thus, the presence of an EIF would increase the risk for Down syndrome above the patient's age related risk of 1 in 123 to approximately 1 in 3962 (1.6%).   After careful consideration, Laurie Trujillo declined [facility] PenceQuad screen today. She stated that she was unsure that the chance estimate provided by this screen would be helpful, particularly after discussion detection and false positive rates. They declined amniocentesis given the associated risk for complications. They understand that screening tests cannot rule out all birth defects or genetic syndromes. The patient was advised of this limitation and states she still does not want additional testing at this time.   Laurie Trujillo was provided with written information regarding sickle cell anemia (SCA) including the carrier frequency and incidence in the Hispanic/African-American population, the availability of carrier testing and prenatal diagnosis if indicated.  In addition, we discussed that hemoglobinopathies are routinely screened for as part of the [place] newborn screening panel.  We reviewed that documentation from Dr.  Lyndal RainbowYalcinkaya's office indicated that she previously had expanded carrier screening performed, which was within normal range for sickle cell, indicating that she is not a carrier for sickle cell trait. The expanded carrier screening was also within normal range  for the additional conditions screened including cystic fibrosis and spinal muscular atrophy. Thus, her risk to be a carrier for these additional conditions has been reduced.   Both family histories were reviewed and found to be contributory for seizures of unknown etiology for the patient and for Mr. Alison Trujillo. Epilepsy occurs in approximately 1% of the population and can have many causes.  Approximately 80% of epilepsy is thought to be idiopathic while the remaining 20% is secondary to a variety of factors such as perinatal events, infections, trauma and genetic disease.  A specific diagnosis in an affected individual is necessary to accurately assess the risk for other family members to develop epilepsy.  In the absence of a known etiology, epilepsy is thought to be caused by a combination of genetic and environmental factors, called multifactorial inheritance. Recurrence risk for epilepsy is estimated to be 4% for offspring of an individual with primary idiopathic epilepsy. It would be important for the couple's pediatrician to be aware of this history so that their child(ren) can be screened and followed appropriately. In the absence of an identified genetic etiology, prenatal screening or testing would not be expected to informative regarding the recurrence risk for epilepsy in pregnancy. Without further information regarding the provided family history, an accurate genetic risk cannot be calculated. Further genetic counseling is warranted if more information is obtained.  Laurie Trujillo denied exposure to environmental toxins or chemical agents. She denied the use of alcohol, tobacco or street drugs. She denied significant viral illnesses during the course of her pregnancy. Her medical and surgical histories were contributory for history of seizures, for which she is taking Keppra and sees neurology regularly. Her history is also contributory for diabetes, for which she is treated with medication. Please see  separate MFM consult  note for detailed discussion regarding this history.    I counseled this couple regarding the above risks and available options.  The approximate face-to-face time with the genetic counselor was 45 minutes.  Quinn Plowman, MS,  Certified Genetic Counselor 05/07/2015

## 2015-05-07 NOTE — Progress Notes (Signed)
[facility] ultrasound  Indication: 40 yr old G1P0 at 67w0dwith type II diabetes, chronic hypertension, and seizure disorder for fetal anatomic survey.  Findings: 1. Single intrauterine pregnancy. 2. Fetal biometry is consistent with dating. 3. Posterior placenta without evidence of previa. 4. Normal amniotic fluid volume. 5. Normal transabdominal cervical length. 6. There is an echogenic focus in the left ventricle. 7. The remainder of the fetal anatomic survey is normal.  Recommendations: 1. Appropriate fetal growth. 2. Fetal anatomic survey is complete. 3. Echogenic focus in the left ventricle/ Advanced maternal age: - patient met with the genetic counselor; see separate report - patient had cell free fetal DNA which revealed low fetal fraction; no result - after counseling patient declined further testing 4. Diabetes: - previously counseled - on insulin - I reviewed the patient's blood sugars; having some low values post breakfast- decrease humalog to 9 units with breakfast - counseled patient to call immediately with values <60 or >200; otherwise [facility] to have insulin adjusted as needed - recommend fetal growth every 4 weeks - recommend fetal echocardiogram- patient reports this is scheduled - recommend start antenatal testing at 32 weeks - recommend start fetal kick counts at 28 weeks - on low dose aspirin 5. Hypertension: - preivously counseled by Dr. BLawerance Cruel- on labetalol - recommend fetal surveillance as above - recommend close surveillance for the development of signs/symptoms of preeclampsia 6. Seizure disorder: - previously counseled - on keppra - followed by Neurology - no recent seizures - recommend fetal surveillance as above  [place]

## 2015-05-10 ENCOUNTER — Encounter (HOSPITAL_COMMUNITY): Payer: Self-pay

## 2015-05-10 ENCOUNTER — Other Ambulatory Visit (HOSPITAL_COMMUNITY): Payer: Self-pay

## 2015-05-19 NOTE — Addendum Note (Signed)
Encounter addended by: Ledon SnareBrian Mirian Casco, MD on: 05/19/2015  1:11 PM<BR>     Documentation filed: Notes Section, Charges VN

## 2015-05-19 NOTE — Progress Notes (Signed)
MFM consult (this is the third time trying to enter this note. Late entry)   40 yr old G1P0 EDD 10/05/2015 with type II diabetes, chronic hypertension, and seizure disorder for MFM consultation  Past Medical History  Diagnosis Date  . Obesities, morbid ([place])   . Complex partial seizures ([place])   . Headache(784.0)   . Seizures ([place])   . Other forms of epilepsy and recurrent seizures without mention of intractable epilepsy 12/07/2012  . Diabetes mellitus without complication ([place])   . Hypertension    Family History  Problem Relation Age of Onset  . Diabetic kidney disease Mother   . Diabetic kidney disease Maternal Grandmother   . High blood pressure Maternal Grandmother   . High blood pressure Paternal Grandmother   . Diabetic kidney disease Maternal Uncle    Allergies  Allergen Reactions  . Metformin And Related Nausea Only   Social History   Social History  . Marital Status: Married    Spouse Name: N/A  . Number of Children: 0  . Years of Education: college   Occupational History  .      [place] History Main Topics  . Smoking status: Never Smoker   . Smokeless tobacco: Not on file  . Alcohol Use: No  . Drug Use: No  . Sexual Activity: Not on file   Other Topics Concern  . Not on file   Social History Narrative   Pt is Rt handed married w/ no children at this time.Pt does take in caffeine at least twice a day.Pt is employed w/ [employer].   Recommendations: 1. Diabetes - has met with dietician and diabetic counselor - parameters for patient to call immediately were discussed - declined first trimester screening at this time - fetal anatomic survey at ~18-[redacted] weeks gestation - serial fetal growth ultrasounds after anatomic survey every 4 weeks - recommend fetal echocardiogram at 24-[redacted] weeks gestation - daily maternal assessment of fetal movement starting ~[redacted] weeks gestation with immediate provider contact for decreased or cessation of  fetal movement. - antenatal testing at ~32 week of soon as clinically indicated 2. Chronic hypertension - on labetalol and recomend daily low dose aspirin - recommend close surveillance for preeclampsia at or after [redacted] weeks gestation 3. Seizure disorder with no recent seizures - on Keppra and followed by neurologist - discussed the literature related to maternal and fetal reported effects 4. Advanced maternal age - declines first trimester screen or invasive diagnostic testing - desires NIPS screening (may have low fetal fraction in a pregnancy with maternal diabetes)   Questions appear answered to her satisfaction Precautions for the above given

## 2015-05-20 ENCOUNTER — Telehealth (HOSPITAL_COMMUNITY): Payer: Self-pay | Admitting: *Deleted

## 2015-05-20 ENCOUNTER — Other Ambulatory Visit (HOSPITAL_COMMUNITY): Payer: Self-pay

## 2015-05-20 NOTE — Telephone Encounter (Signed)
TC to patient regarding blood sugar log that she faxed for review.  Pt name and DOB verified. Blood sugar log reviewed by Dr. Sherrie Georgeecker, no changes to medication needed.  Concerned about low sugar levels on Sunday, pt stated that with her church schedule she has a difficult time fitting snacks into her diet.  Recommended she increase protein at meal time.  Pt voiced understanding and will send her log next week for review.  Blood sugar log scanned to chart.

## 2015-05-20 NOTE — Telephone Encounter (Signed)
03/18/2015 01:55 PM Phone (Outgoing) Laurie Trujillo, Laurie Trujillo (Self) [phone] (H)     Left Message-  called patient to review glucose readings. No answer. left message with my cell ph number to return call or I will try to call her again tomorrow.    03/21/2015 04:51 PM Phone (Outgoing) Laurie Trujillo, Laurie Trujillo (Self) [phone] (H)    Completed-  FBS range 83-103mg /dl, 2hppbreakfast [id number], 2hpp lunch 93-225(subway,chips,cookie), 2hppdinner 83-169. Increase meal coverage at lunch & dinner to 12units. Pt has appt Dr. Vincente PoliGrewal 03/27/15 will call me with update from Dr. Vincente PoliGrewal.    04/05/2015 09:50 AM Phone (Incoming) Laurie Trujillo, Laurie Trujillo (Self) [phone] (H)    Patient called and left message wanting to talk about her insulin.    04/05/2015 11:20 AM Phone (Outgoing) Laurie Trujillo, Laurie Trujillo (Self) [phone] (H)    Completed-  FBS range 70-92, 2hpp breakfast 85-117, 2hpp lunch 101-188, 2hpp dinner 124-193. Patient did not validate with with Dr. Vincente PoliGrewal who she wanted managing her diabetes. Will increase meal cover to 11u brekfast, 15u lunch&dinner. Asked to please confirm mgt

## 2015-05-27 ENCOUNTER — Telehealth (HOSPITAL_COMMUNITY): Payer: Self-pay | Admitting: *Deleted

## 2015-05-27 ENCOUNTER — Other Ambulatory Visit (HOSPITAL_COMMUNITY): Payer: Self-pay

## 2015-05-27 NOTE — Telephone Encounter (Signed)
Pt faxed blood sugar log for review.  Log reviewed by Dr. Sherrie Georgeecker, scanned to chart. TC to pt, name and DOB verified. No change in insulin needed at this time.  Discussed a bedtime snack that included protein, pt voiced understanding.  She will fax log next week for review.

## 2015-06-03 ENCOUNTER — Telehealth (HOSPITAL_COMMUNITY): Payer: Self-pay | Admitting: *Deleted

## 2015-06-03 ENCOUNTER — Other Ambulatory Visit (HOSPITAL_COMMUNITY): Payer: Self-pay

## 2015-06-03 NOTE — Telephone Encounter (Signed)
TC to patient regarding blood sugar logs that she had faxed this morning for review.  Name and DOB verified.  Dr. Sherrie Georgeecker rev'd log, RN instructed pt to decrease evening dose of humulin to 20units and Humalog lunch and dinner to 12units.  Pt voiced understanding.  Encouraged patient to have a protein snack prior to working out to help maintain blood sugar level.  All questions answered, pt had no further questions.

## 2015-06-04 ENCOUNTER — Other Ambulatory Visit (HOSPITAL_COMMUNITY): Payer: Self-pay | Admitting: *Deleted

## 2015-06-04 ENCOUNTER — Ambulatory Visit (HOSPITAL_COMMUNITY)
Admission: RE | Admit: 2015-06-04 | Discharge: 2015-06-04 | Disposition: A | Discharge status: Home / Self Care | Payer: BLUE CROSS/BLUE SHIELD | Source: Ambulatory Visit | Attending: Obstetrics and Gynecology | Admitting: Obstetrics and Gynecology

## 2015-06-04 ENCOUNTER — Encounter (HOSPITAL_COMMUNITY): Payer: Self-pay

## 2015-06-04 DIAGNOSIS — Z3A22 22 weeks gestation of pregnancy: Secondary | ICD-10-CM | POA: Insufficient documentation

## 2015-06-04 DIAGNOSIS — O99352 Diseases of the nervous system complicating pregnancy, second trimester: Secondary | ICD-10-CM | POA: Insufficient documentation

## 2015-06-04 DIAGNOSIS — O24312 Unspecified pre-existing diabetes mellitus in pregnancy, second trimester: Secondary | ICD-10-CM | POA: Diagnosis present

## 2015-06-04 DIAGNOSIS — G40909 Epilepsy, unspecified, not intractable, without status epilepticus: Secondary | ICD-10-CM | POA: Insufficient documentation

## 2015-06-04 DIAGNOSIS — O10012 Pre-existing essential hypertension complicating pregnancy, second trimester: Secondary | ICD-10-CM | POA: Insufficient documentation

## 2015-06-04 DIAGNOSIS — O10919 Unspecified pre-existing hypertension complicating pregnancy, unspecified trimester: Secondary | ICD-10-CM

## 2015-06-04 DIAGNOSIS — O09512 Supervision of elderly primigravida, second trimester: Secondary | ICD-10-CM | POA: Diagnosis not present

## 2015-06-04 DIAGNOSIS — O09812 Supervision of pregnancy resulting from assisted reproductive technology, second trimester: Secondary | ICD-10-CM | POA: Diagnosis not present

## 2015-06-04 DIAGNOSIS — O24919 Unspecified diabetes mellitus in pregnancy, unspecified trimester: Secondary | ICD-10-CM

## 2015-06-25 ENCOUNTER — Ambulatory Visit (INDEPENDENT_AMBULATORY_CARE_PROVIDER_SITE_OTHER): Payer: BLUE CROSS/BLUE SHIELD | Admitting: Adult Health

## 2015-06-25 ENCOUNTER — Encounter: Payer: Self-pay | Admitting: Adult Health

## 2015-06-25 VITALS — BP 112/66 | HR 94 | Resp 20 | Ht 61.0 in | Wt 209.0 lb

## 2015-06-25 DIAGNOSIS — R569 Unspecified convulsions: Secondary | ICD-10-CM | POA: Diagnosis not present

## 2015-06-25 DIAGNOSIS — Z3482 Encounter for supervision of other normal pregnancy, second trimester: Secondary | ICD-10-CM

## 2015-06-25 DIAGNOSIS — Z3492 Encounter for supervision of normal pregnancy, unspecified, second trimester: Secondary | ICD-10-CM

## 2015-06-25 NOTE — Patient Instructions (Signed)
Continue Keppra and Folic acid If your symptoms worsen or you develop new symptoms please let us know.

## 2015-06-25 NOTE — Progress Notes (Signed)
I agree with the assessment and plan as directed by NP .The patient is known to me .   Kisean Rollo, MD  

## 2015-06-25 NOTE — Progress Notes (Signed)
PATIENT: Laurie Trujillo DOB: 07/29/1975  REASON FOR VISIT: follow up- seizures, pregnancy HISTORY FROM: patient  HISTORY OF PRESENT ILLNESS: Laurie Trujillo is a 40 year old female with a history of seizures and she is currently pregnant. She returns today for follow-up. She is on Keppra 750 mg twice a day. She reports that she is not had any seizure events while pregnant. She states that she is tolerating Keppra well. She reports that her pregnancy is going as planned with no complications. At home she is able to complete all ADLs independently. She operates a Librarian, academic without difficulty. She is on folic acid. She denies any new neurological symptoms. She returns today for an evaluation.  HISTORY (DOHMEIER):  12/07/12 (CD): [name] was last seen on 12-02-11 after she had suffered a seizure or presumed to be a seizure on September 13th, 2013. She described it as a feeling familiar to her before she was treated with carbamazepine. She hasn't had a seizure or aura for many years- but the seizures that have happened before were related to late medication intake times or skipped doses. She also noted a trigger of sleep deprivation. She does not drink alcohol and she has had regular work hours and she was productive but allowed to drive after last visit with her. The patient has primary partial complex epilepsy and she has been very well controlled on the carbamazepine brand-name Tegretol. She's here today to get her refills. I gave her copies of our last visit notes and lab tests.  05/25/13 (LL): Patient here for regular followup, last visit 12/07/12. She has not had any seizures since last visit, doing well. Trying to conceive, but has not been successful yet. No complaints.  Interval history 05-28-14, Laurie Trujillo had been seizure-free from October 2014 until February 2016 , when she suffered a seizure during her sleep, of which she was not aware.  Her husband witnessed the event and then another  nocturnal seizure on March 20- 16, last week. Both seizures ended without any self injury she did not have a tongue bite, there is no bruising noted. She did not become cyanotic or short any sign of physiologic distress. There was no associated bowel bladder or bowel incontinence. Her husband did not make any attempts to wake her and I'm not sure how long she may have been confused if there was such a possible postictal. She has noticed that both of these nocturnal spells happened after she took her medication in a more irregular time. We discussed today of the should put her on an she's currently taking Tegretol-XR 200 mg 12 hour release form 2 in the morning 3 in the afternoon 12 hours apart. I would like to try to put the patient on a once a day Tegretol form. This will make her compliance easier. Patients with nocturnal seizure only are not refrained from driving.  We dicussed alcohol, sleep deprivation , flashing strobe lights as possible triggers.  Her last MRI of the brain was at [facility] neurology, under Dr. Jennet Maduro.   Interval history from 11-29-14. Since the patient was last seen in February 2016 she had 4 more seizures.  She is currently still on hormonal therapy with carbamazepine.  She has been compliant with the medication.  Complicating the issue is that she is going through fertility treatments locally with Dr. Fermin Schwab, MD. she is beginnin treatment in association with progesterone in sesame oil, Medrol tablets, doxycycline 100 mg tablets and of the vivelle dot .patch 0.1  mg. She also takesMenopur injections. 0.25 milligrams for 7 days during that treatment cycle. She will then use beta HCG in injectable form.  Progesterone has been suspected to decrease his she is a fresh old for this reason I would like for her to use Keppra as an associated drug, that is not known to influence fertility and has no effect on hepatic or renal function. I would consider keeping her on  her CBZ medication throughout pregnancy , but the drug is category C.  May use Keppra, We will not use phenytoin, topiramate, or Depakote for obvious reasons.   Interval history from 12-31-14,  Laurie Trujillo is here for a visit as requested, She has successfully weaned off carbamazepine-Tegretol and has begun injection therapy with a fertility clinic. She is optimistic. She is not experiencing any seizures since her last visit a month ago. She's taking the 24 hour extended release form of Keppra 500 mg 2 a day.  Interval history from the 14th of February 2017, Laurie Trujillo is pregnant with her first child she has now reached [redacted] weeks gestational pregnancy. She had no seizures since the beginning of the pregnancy has no longer taking Tegretol and is currently controlled on Keppra only. The patient was advised of SUDEP and we decided to increase the Keppra to 750 mg bid po. I will start this with her next refill.  SUDEP phenomenon is a higher risk of sudden unexplained death in epileptics, and e affects the least well controlled seizure patients in Disproportion. She is using insulin and no longer oral Glipizide  REVIEW OF SYSTEMS: Out of a complete 14 system review of symptoms, the patient complains only of the following symptoms, and all other reviewed systems are negative.  See history of present illness  ALLERGIES: Allergies  Allergen Reactions  . Metformin And Related Nausea Only    HOME MEDICATIONS: Outpatient Prescriptions Prior to Visit  Medication Sig Dispense Refill  . aspirin 81 MG chewable tablet Chew by mouth.    . folic acid (FOLVITE) 1 MG tablet TAKE 1 TABLET BY MOUTH EVERY DAY    . Insulin Degludec (TRESIBA FLEXTOUCH) 100 UNIT/ML SOPN Reported on 06/04/2015    . insulin lispro (HUMALOG) 100 UNIT/ML injection Inject 7 Units into the skin 3 (three) times daily with meals.    . insulin NPH Human (HUMULIN N,NOVOLIN N) 100 UNIT/ML injection Inject 21 Units into the skin 2  (two) times daily at 8 am and 10 pm.    . labetalol (NORMODYNE) 100 MG tablet Take 100 mg by mouth.    . levETIRAcetam (KEPPRA) 750 MG tablet Take 1 tablet (750 mg total) by mouth 2 (two) times daily. 60 tablet 5  . Multiple Vitamin (MULTIVITAMIN) tablet Take 1 tablet by mouth daily. Reported on 06/04/2015    . Prenatal Vit-Fe Fumarate-FA (PRENATAL ONE DAILY) 27-0.8 MG TABS Take 1 tablet by mouth.    . vitamin E 100 UNIT capsule Take by mouth daily.     No facility-administered medications prior to visit.    PAST MEDICAL HISTORY: Past Medical History  Diagnosis Date  . Obesities, morbid (HCC)   . Complex partial seizures (HCC)   . Headache(784.0)   . Seizures (HCC)   . Other forms of epilepsy and recurrent seizures without mention of intractable epilepsy 12/07/2012  . Diabetes mellitus without complication (HCC)   . Hypertension     PAST SURGICAL HISTORY: No past surgical history on file.  FAMILY HISTORY: Family History  Problem Relation Age  of Onset  . Diabetic kidney disease Mother   . Diabetic kidney disease Maternal Grandmother   . High blood pressure Maternal Grandmother   . High blood pressure Paternal Grandmother   . Diabetic kidney disease Maternal Uncle     SOCIAL HISTORY: Social History   Social History  . Marital Status: Married    Spouse Name: N/A  . Number of Children: 0  . Years of Education: college   Occupational History  .      [employer]   Social History Main Topics  . Smoking status: Never Smoker   . Smokeless tobacco: Not on file  . Alcohol Use: No  . Drug Use: No  . Sexual Activity: Not on file   Other Topics Concern  . Not on file   Social History Narrative   Pt is Rt handed married w/ no children at this time.Pt does take in caffeine at least twice a day.Pt is employed w/ [employer].      PHYSICAL EXAM  Filed Vitals:   06/25/15 0744  BP: 112/66  Pulse: 94  Resp: 20  Height: 5\' 1"  (1.549 m)  Weight: 209 lb  (94.802 kg)   Body mass index is 39.51 kg/(m^2).  Generalized: Well developed, in no acute distress   Neurological examination  Mentation: Alert oriented to time, place, history taking. Follows all commands speech and language fluent Cranial nerve II-XII: Pupils were equal round reactive to light. Extraocular movements were full, visual field were full on confrontational test. Facial sensation and strength were normal. Uvula tongue midline. Head turning and shoulder shrug  were normal and symmetric. Motor: The motor testing reveals 5 over 5 strength of all 4 extremities. Good symmetric motor tone is noted throughout.  Sensory: Sensory testing is intact to soft touch on all 4 extremities. No evidence of extinction is noted.  Coordination: Cerebellar testing reveals good finger-nose-finger and heel-to-shin bilaterally.  Gait and station: Gait is normal. Tandem gait is normal. Romberg is negative. No drift is seen.  Reflexes: Deep tendon reflexes are symmetric and normal bilaterally.   DIAGNOSTIC DATA (LABS, IMAGING, TESTING) - I reviewed patient records, labs, notes, testing and imaging myself where available.     ASSESSMENT AND PLAN 40 y.o. year old female  has a past medical history of Obesities, morbid (HCC); Complex partial seizures (HCC); Headache(784.0); Seizures (HCC); Other forms of epilepsy and recurrent seizures without mention of intractable epilepsy (12/07/2012); Diabetes mellitus without complication (HCC); and Hypertension. here with:  1. Seizures 2. Pregnancy second trimester  Overall the patient is doing well. She will continue on Keppra 750 mg twice a day. Her OB/GYN  checks blood work regularly. She will remain on folic acid 1 mg daily. Patient advised that if she has any seizure events she should let us know. She will follow-up in 3 months with Dr. Vergia Alconohmeier    Kailash Hinze, MSN, NP-C 06/25/2015, 7:41 AM [facility] [address], [address] [place], [place] [address] [phone]

## 2015-07-02 ENCOUNTER — Ambulatory Visit (HOSPITAL_COMMUNITY)
Admission: RE | Admit: 2015-07-02 | Discharge: 2015-07-02 | Disposition: A | Discharge status: Home / Self Care | Payer: BLUE CROSS/BLUE SHIELD | Source: Ambulatory Visit | Attending: Obstetrics and Gynecology | Admitting: Obstetrics and Gynecology

## 2015-07-02 ENCOUNTER — Other Ambulatory Visit: Payer: Self-pay | Admitting: Obstetrics and Gynecology

## 2015-07-02 ENCOUNTER — Encounter (HOSPITAL_COMMUNITY): Payer: Self-pay

## 2015-07-02 DIAGNOSIS — O10919 Unspecified pre-existing hypertension complicating pregnancy, unspecified trimester: Secondary | ICD-10-CM

## 2015-07-02 DIAGNOSIS — O10012 Pre-existing essential hypertension complicating pregnancy, second trimester: Secondary | ICD-10-CM | POA: Diagnosis not present

## 2015-07-02 DIAGNOSIS — G40909 Epilepsy, unspecified, not intractable, without status epilepticus: Secondary | ICD-10-CM | POA: Diagnosis not present

## 2015-07-02 DIAGNOSIS — O24312 Unspecified pre-existing diabetes mellitus in pregnancy, second trimester: Secondary | ICD-10-CM | POA: Insufficient documentation

## 2015-07-02 DIAGNOSIS — O09512 Supervision of elderly primigravida, second trimester: Secondary | ICD-10-CM | POA: Insufficient documentation

## 2015-07-02 DIAGNOSIS — O99352 Diseases of the nervous system complicating pregnancy, second trimester: Secondary | ICD-10-CM | POA: Insufficient documentation

## 2015-07-02 DIAGNOSIS — Z3A26 26 weeks gestation of pregnancy: Secondary | ICD-10-CM | POA: Diagnosis present

## 2015-07-02 DIAGNOSIS — O09812 Supervision of pregnancy resulting from assisted reproductive technology, second trimester: Secondary | ICD-10-CM | POA: Insufficient documentation

## 2015-07-26 ENCOUNTER — Encounter (HOSPITAL_COMMUNITY): Payer: Self-pay

## 2015-07-30 ENCOUNTER — Other Ambulatory Visit (HOSPITAL_COMMUNITY): Payer: Self-pay | Admitting: Obstetrics and Gynecology

## 2015-07-30 ENCOUNTER — Encounter (HOSPITAL_COMMUNITY): Payer: Self-pay

## 2015-07-30 ENCOUNTER — Ambulatory Visit (HOSPITAL_COMMUNITY)
Admission: RE | Admit: 2015-07-30 | Discharge: 2015-07-30 | Disposition: A | Discharge status: Home / Self Care | Payer: BLUE CROSS/BLUE SHIELD | Source: Ambulatory Visit | Attending: Obstetrics and Gynecology | Admitting: Obstetrics and Gynecology

## 2015-07-30 DIAGNOSIS — Z3A30 30 weeks gestation of pregnancy: Secondary | ICD-10-CM

## 2015-07-30 DIAGNOSIS — O09513 Supervision of elderly primigravida, third trimester: Secondary | ICD-10-CM

## 2015-07-30 DIAGNOSIS — O10019 Pre-existing essential hypertension complicating pregnancy, unspecified trimester: Secondary | ICD-10-CM | POA: Insufficient documentation

## 2015-07-30 DIAGNOSIS — O99353 Diseases of the nervous system complicating pregnancy, third trimester: Secondary | ICD-10-CM

## 2015-07-30 DIAGNOSIS — O10919 Unspecified pre-existing hypertension complicating pregnancy, unspecified trimester: Secondary | ICD-10-CM

## 2015-07-30 DIAGNOSIS — G40909 Epilepsy, unspecified, not intractable, without status epilepticus: Secondary | ICD-10-CM

## 2015-07-30 DIAGNOSIS — O24113 Pre-existing diabetes mellitus, type 2, in pregnancy, third trimester: Secondary | ICD-10-CM | POA: Diagnosis not present

## 2015-07-30 DIAGNOSIS — O09813 Supervision of pregnancy resulting from assisted reproductive technology, third trimester: Secondary | ICD-10-CM | POA: Diagnosis not present

## 2015-07-30 DIAGNOSIS — O24313 Unspecified pre-existing diabetes mellitus in pregnancy, third trimester: Secondary | ICD-10-CM

## 2015-08-15 ENCOUNTER — Other Ambulatory Visit (HOSPITAL_COMMUNITY): Payer: Self-pay

## 2015-08-27 ENCOUNTER — Ambulatory Visit (HOSPITAL_COMMUNITY)
Admission: RE | Admit: 2015-08-27 | Discharge: 2015-08-27 | Disposition: A | Discharge status: Home / Self Care | Payer: BLUE CROSS/BLUE SHIELD | Source: Ambulatory Visit | Attending: Obstetrics and Gynecology | Admitting: Obstetrics and Gynecology

## 2015-08-27 ENCOUNTER — Other Ambulatory Visit (HOSPITAL_COMMUNITY): Payer: Self-pay | Admitting: *Deleted

## 2015-08-27 ENCOUNTER — Encounter (HOSPITAL_COMMUNITY): Payer: Self-pay

## 2015-08-27 DIAGNOSIS — O99353 Diseases of the nervous system complicating pregnancy, third trimester: Secondary | ICD-10-CM

## 2015-08-27 DIAGNOSIS — Z3A34 34 weeks gestation of pregnancy: Secondary | ICD-10-CM | POA: Insufficient documentation

## 2015-08-27 DIAGNOSIS — Z3A37 37 weeks gestation of pregnancy: Secondary | ICD-10-CM

## 2015-08-27 DIAGNOSIS — O24919 Unspecified diabetes mellitus in pregnancy, unspecified trimester: Principal | ICD-10-CM

## 2015-08-27 DIAGNOSIS — G40909 Epilepsy, unspecified, not intractable, without status epilepticus: Secondary | ICD-10-CM | POA: Insufficient documentation

## 2015-08-27 DIAGNOSIS — O10013 Pre-existing essential hypertension complicating pregnancy, third trimester: Secondary | ICD-10-CM | POA: Diagnosis present

## 2015-08-27 DIAGNOSIS — O09513 Supervision of elderly primigravida, third trimester: Secondary | ICD-10-CM | POA: Diagnosis not present

## 2015-08-27 DIAGNOSIS — O24113 Pre-existing diabetes mellitus, type 2, in pregnancy, third trimester: Secondary | ICD-10-CM | POA: Diagnosis not present

## 2015-08-27 DIAGNOSIS — O10919 Unspecified pre-existing hypertension complicating pregnancy, unspecified trimester: Secondary | ICD-10-CM

## 2015-08-27 DIAGNOSIS — Z794 Long term (current) use of insulin: Principal | ICD-10-CM

## 2015-08-27 DIAGNOSIS — O09813 Supervision of pregnancy resulting from assisted reproductive technology, third trimester: Secondary | ICD-10-CM | POA: Diagnosis not present

## 2015-08-27 DIAGNOSIS — IMO0001 Reserved for inherently not codable concepts without codable children: Secondary | ICD-10-CM

## 2015-09-02 ENCOUNTER — Other Ambulatory Visit (HOSPITAL_COMMUNITY): Payer: Self-pay | Admitting: Obstetrics and Gynecology

## 2015-09-02 ENCOUNTER — Encounter (HOSPITAL_COMMUNITY): Payer: Self-pay

## 2015-09-02 ENCOUNTER — Ambulatory Visit (HOSPITAL_COMMUNITY)
Admission: RE | Admit: 2015-09-02 | Discharge: 2015-09-02 | Disposition: A | Discharge status: Home / Self Care | Payer: BLUE CROSS/BLUE SHIELD | Source: Ambulatory Visit | Attending: Obstetrics and Gynecology | Admitting: Obstetrics and Gynecology

## 2015-09-02 DIAGNOSIS — O24113 Pre-existing diabetes mellitus, type 2, in pregnancy, third trimester: Secondary | ICD-10-CM | POA: Diagnosis present

## 2015-09-02 DIAGNOSIS — Z3A34 34 weeks gestation of pregnancy: Secondary | ICD-10-CM

## 2015-09-02 DIAGNOSIS — O09813 Supervision of pregnancy resulting from assisted reproductive technology, third trimester: Secondary | ICD-10-CM | POA: Insufficient documentation

## 2015-09-02 DIAGNOSIS — O99353 Diseases of the nervous system complicating pregnancy, third trimester: Secondary | ICD-10-CM

## 2015-09-02 DIAGNOSIS — O10013 Pre-existing essential hypertension complicating pregnancy, third trimester: Secondary | ICD-10-CM | POA: Insufficient documentation

## 2015-09-02 DIAGNOSIS — O24013 Pre-existing diabetes mellitus, type 1, in pregnancy, third trimester: Secondary | ICD-10-CM | POA: Insufficient documentation

## 2015-09-02 DIAGNOSIS — G40909 Epilepsy, unspecified, not intractable, without status epilepticus: Secondary | ICD-10-CM | POA: Diagnosis not present

## 2015-09-02 DIAGNOSIS — O169 Unspecified maternal hypertension, unspecified trimester: Secondary | ICD-10-CM

## 2015-09-02 DIAGNOSIS — O24919 Unspecified diabetes mellitus in pregnancy, unspecified trimester: Secondary | ICD-10-CM

## 2015-09-02 DIAGNOSIS — O10919 Unspecified pre-existing hypertension complicating pregnancy, unspecified trimester: Secondary | ICD-10-CM

## 2015-09-02 DIAGNOSIS — O09513 Supervision of elderly primigravida, third trimester: Secondary | ICD-10-CM

## 2015-09-02 DIAGNOSIS — IMO0001 Reserved for inherently not codable concepts without codable children: Secondary | ICD-10-CM

## 2015-09-02 DIAGNOSIS — Z794 Long term (current) use of insulin: Secondary | ICD-10-CM

## 2015-09-10 ENCOUNTER — Encounter (HOSPITAL_COMMUNITY): Payer: Self-pay

## 2015-09-10 ENCOUNTER — Ambulatory Visit (HOSPITAL_COMMUNITY)
Admission: RE | Admit: 2015-09-10 | Discharge: 2015-09-10 | Disposition: A | Discharge status: Home / Self Care | Payer: BLUE CROSS/BLUE SHIELD | Source: Ambulatory Visit | Attending: Obstetrics and Gynecology | Admitting: Obstetrics and Gynecology

## 2015-09-10 ENCOUNTER — Other Ambulatory Visit (HOSPITAL_COMMUNITY): Payer: Self-pay | Admitting: Obstetrics and Gynecology

## 2015-09-10 DIAGNOSIS — Z3A36 36 weeks gestation of pregnancy: Secondary | ICD-10-CM | POA: Insufficient documentation

## 2015-09-10 DIAGNOSIS — O09513 Supervision of elderly primigravida, third trimester: Secondary | ICD-10-CM

## 2015-09-10 DIAGNOSIS — O24919 Unspecified diabetes mellitus in pregnancy, unspecified trimester: Principal | ICD-10-CM

## 2015-09-10 DIAGNOSIS — Z794 Long term (current) use of insulin: Secondary | ICD-10-CM

## 2015-09-10 DIAGNOSIS — O24113 Pre-existing diabetes mellitus, type 2, in pregnancy, third trimester: Secondary | ICD-10-CM | POA: Diagnosis present

## 2015-09-10 DIAGNOSIS — O09813 Supervision of pregnancy resulting from assisted reproductive technology, third trimester: Secondary | ICD-10-CM | POA: Diagnosis not present

## 2015-09-10 DIAGNOSIS — IMO0001 Reserved for inherently not codable concepts without codable children: Secondary | ICD-10-CM

## 2015-09-10 DIAGNOSIS — G40909 Epilepsy, unspecified, not intractable, without status epilepticus: Secondary | ICD-10-CM | POA: Insufficient documentation

## 2015-09-10 DIAGNOSIS — O99353 Diseases of the nervous system complicating pregnancy, third trimester: Secondary | ICD-10-CM | POA: Insufficient documentation

## 2015-09-10 DIAGNOSIS — O10013 Pre-existing essential hypertension complicating pregnancy, third trimester: Secondary | ICD-10-CM | POA: Insufficient documentation

## 2015-09-17 ENCOUNTER — Ambulatory Visit (HOSPITAL_COMMUNITY)
Admission: RE | Admit: 2015-09-17 | Discharge: 2015-09-17 | Disposition: A | Discharge status: Home / Self Care | Payer: BLUE CROSS/BLUE SHIELD | Source: Ambulatory Visit | Attending: Obstetrics and Gynecology | Admitting: Obstetrics and Gynecology

## 2015-09-17 ENCOUNTER — Encounter (HOSPITAL_COMMUNITY): Payer: Self-pay

## 2015-09-17 ENCOUNTER — Other Ambulatory Visit (HOSPITAL_COMMUNITY): Payer: Self-pay | Admitting: Obstetrics and Gynecology

## 2015-09-17 DIAGNOSIS — O09813 Supervision of pregnancy resulting from assisted reproductive technology, third trimester: Secondary | ICD-10-CM | POA: Diagnosis not present

## 2015-09-17 DIAGNOSIS — O10019 Pre-existing essential hypertension complicating pregnancy, unspecified trimester: Secondary | ICD-10-CM

## 2015-09-17 DIAGNOSIS — O09513 Supervision of elderly primigravida, third trimester: Secondary | ICD-10-CM | POA: Diagnosis not present

## 2015-09-17 DIAGNOSIS — G40909 Epilepsy, unspecified, not intractable, without status epilepticus: Secondary | ICD-10-CM

## 2015-09-17 DIAGNOSIS — O99350 Diseases of the nervous system complicating pregnancy, unspecified trimester: Secondary | ICD-10-CM

## 2015-09-17 DIAGNOSIS — O99353 Diseases of the nervous system complicating pregnancy, third trimester: Secondary | ICD-10-CM | POA: Diagnosis not present

## 2015-09-17 DIAGNOSIS — Z3A37 37 weeks gestation of pregnancy: Secondary | ICD-10-CM

## 2015-09-17 DIAGNOSIS — Z794 Long term (current) use of insulin: Secondary | ICD-10-CM

## 2015-09-17 DIAGNOSIS — O24113 Pre-existing diabetes mellitus, type 2, in pregnancy, third trimester: Secondary | ICD-10-CM

## 2015-09-17 DIAGNOSIS — O10013 Pre-existing essential hypertension complicating pregnancy, third trimester: Secondary | ICD-10-CM | POA: Insufficient documentation

## 2015-09-17 DIAGNOSIS — IMO0001 Reserved for inherently not codable concepts without codable children: Secondary | ICD-10-CM

## 2015-09-17 DIAGNOSIS — O24919 Unspecified diabetes mellitus in pregnancy, unspecified trimester: Secondary | ICD-10-CM

## 2015-09-19 ENCOUNTER — Encounter (HOSPITAL_COMMUNITY): Payer: Self-pay | Admitting: *Deleted

## 2015-09-19 ENCOUNTER — Telehealth (HOSPITAL_COMMUNITY): Payer: Self-pay | Admitting: *Deleted

## 2015-09-19 NOTE — Telephone Encounter (Signed)
Preadmission screen  

## 2015-09-23 ENCOUNTER — Ambulatory Visit (HOSPITAL_COMMUNITY)
Admission: RE | Admit: 2015-09-23 | Discharge: 2015-09-23 | Disposition: A | Discharge status: Home / Self Care | Payer: BLUE CROSS/BLUE SHIELD | Source: Ambulatory Visit | Attending: Obstetrics and Gynecology | Admitting: Obstetrics and Gynecology

## 2015-09-23 ENCOUNTER — Inpatient Hospital Stay (HOSPITAL_COMMUNITY): Payer: BLUE CROSS/BLUE SHIELD | Admitting: Anesthesiology

## 2015-09-23 ENCOUNTER — Encounter (HOSPITAL_COMMUNITY): Payer: Self-pay

## 2015-09-23 ENCOUNTER — Inpatient Hospital Stay (HOSPITAL_COMMUNITY)
Admission: AD | Admit: 2015-09-23 | Discharge: 2015-09-26 | DRG: 765 | Disposition: A | Discharge status: Home / Self Care | Payer: BLUE CROSS/BLUE SHIELD | Source: Ambulatory Visit | Attending: Obstetrics and Gynecology | Admitting: Obstetrics and Gynecology

## 2015-09-23 ENCOUNTER — Encounter (HOSPITAL_COMMUNITY)
Admission: AD | Disposition: A | Discharge status: Home / Self Care | Payer: Self-pay | Source: Ambulatory Visit | Attending: Obstetrics and Gynecology

## 2015-09-23 DIAGNOSIS — O24919 Unspecified diabetes mellitus in pregnancy, unspecified trimester: Principal | ICD-10-CM

## 2015-09-23 DIAGNOSIS — K219 Gastro-esophageal reflux disease without esophagitis: Secondary | ICD-10-CM | POA: Diagnosis present

## 2015-09-23 DIAGNOSIS — G40209 Localization-related (focal) (partial) symptomatic epilepsy and epileptic syndromes with complex partial seizures, not intractable, without status epilepticus: Secondary | ICD-10-CM | POA: Diagnosis present

## 2015-09-23 DIAGNOSIS — O9962 Diseases of the digestive system complicating childbirth: Secondary | ICD-10-CM | POA: Diagnosis present

## 2015-09-23 DIAGNOSIS — O2412 Pre-existing diabetes mellitus, type 2, in childbirth: Secondary | ICD-10-CM | POA: Diagnosis present

## 2015-09-23 DIAGNOSIS — Z3490 Encounter for supervision of normal pregnancy, unspecified, unspecified trimester: Secondary | ICD-10-CM

## 2015-09-23 DIAGNOSIS — E119 Type 2 diabetes mellitus without complications: Secondary | ICD-10-CM | POA: Diagnosis present

## 2015-09-23 DIAGNOSIS — Z8249 Family history of ischemic heart disease and other diseases of the circulatory system: Secondary | ICD-10-CM

## 2015-09-23 DIAGNOSIS — O1002 Pre-existing essential hypertension complicating childbirth: Principal | ICD-10-CM | POA: Diagnosis present

## 2015-09-23 DIAGNOSIS — Z794 Long term (current) use of insulin: Secondary | ICD-10-CM | POA: Diagnosis not present

## 2015-09-23 DIAGNOSIS — Z833 Family history of diabetes mellitus: Secondary | ICD-10-CM

## 2015-09-23 DIAGNOSIS — Z3A38 38 weeks gestation of pregnancy: Secondary | ICD-10-CM

## 2015-09-23 DIAGNOSIS — O99354 Diseases of the nervous system complicating childbirth: Secondary | ICD-10-CM | POA: Diagnosis present

## 2015-09-23 DIAGNOSIS — Z6841 Body Mass Index (BMI) 40.0 and over, adult: Secondary | ICD-10-CM

## 2015-09-23 DIAGNOSIS — O99214 Obesity complicating childbirth: Secondary | ICD-10-CM | POA: Diagnosis present

## 2015-09-23 DIAGNOSIS — O365930 Maternal care for other known or suspected poor fetal growth, third trimester, not applicable or unspecified: Secondary | ICD-10-CM | POA: Diagnosis present

## 2015-09-23 DIAGNOSIS — IMO0001 Reserved for inherently not codable concepts without codable children: Secondary | ICD-10-CM

## 2015-09-23 LAB — GLUCOSE, CAPILLARY
GLUCOSE-CAPILLARY: 79 mg/dL (ref 65–99)
Glucose-Capillary: 63 mg/dL — ABNORMAL LOW (ref 65–99)
Glucose-Capillary: 93 mg/dL (ref 65–99)

## 2015-09-23 LAB — CBC
HCT: 37.7 % (ref 36.0–46.0)
Hemoglobin: 13.4 g/dL (ref 12.0–15.0)
MCH: 29.1 pg (ref 26.0–34.0)
MCHC: 35.5 g/dL (ref 30.0–36.0)
MCV: 82 fL (ref 78.0–100.0)
Platelets: 309 10*3/uL (ref 150–400)
RBC: 4.6 MIL/uL (ref 3.87–5.11)
RDW: 14 % (ref 11.5–15.5)
WBC: 11.5 10*3/uL — ABNORMAL HIGH (ref 4.0–10.5)

## 2015-09-23 LAB — URINALYSIS, ROUTINE W REFLEX MICROSCOPIC
Bilirubin Urine: NEGATIVE
Glucose, UA: NEGATIVE mg/dL
HGB URINE DIPSTICK: NEGATIVE
KETONES UR: NEGATIVE mg/dL
Nitrite: NEGATIVE
PROTEIN: NEGATIVE mg/dL
Specific Gravity, Urine: <1.005 — ABNORMAL LOW (ref 1.005–1.030)
pH: 6 (ref 5.0–8.0)

## 2015-09-23 LAB — TYPE AND SCREEN
ABO/RH(D): O POS
ANTIBODY SCREEN: NEGATIVE

## 2015-09-23 LAB — URINE MICROSCOPIC-ADD ON: RBC / HPF: NONE SEEN RBC/hpf (ref 0–5)

## 2015-09-23 LAB — ABO/RH: ABO/RH(D): O POS

## 2015-09-23 LAB — OB RESULTS CONSOLE GBS: GBS: NEGATIVE

## 2015-09-23 SURGERY — Surgical Case
Anesthesia: Spinal | Site: Abdomen | Wound class: Clean Contaminated

## 2015-09-23 MED ORDER — PRENATAL MULTIVITAMIN CH
1.0000 | ORAL_TABLET | Freq: Every day | ORAL | Status: DC
  Administered 2015-09-24 – 2015-09-25 (×2): 1 via ORAL
  Filled 2015-09-23 (×2): qty 1

## 2015-09-23 MED ORDER — DIPHENHYDRAMINE HCL 25 MG PO CAPS: 25.0000 mg | ORAL_CAPSULE | Freq: Four times a day (QID) | ORAL | Status: DC | PRN

## 2015-09-23 MED ORDER — MISOPROSTOL 25 MCG QUARTER TABLET
25.0000 ug | ORAL_TABLET | ORAL | Status: DC | PRN
  Administered 2015-09-23: 25 ug via VAGINAL
  Filled 2015-09-23 (×2): qty 0.25

## 2015-09-23 MED ORDER — OXYTOCIN 40 UNITS IN LACTATED RINGERS INFUSION - SIMPLE MED: 2.5000 [IU]/h | INTRAVENOUS | Status: DC

## 2015-09-23 MED ORDER — TERBUTALINE SULFATE 1 MG/ML IJ SOLN: 0.2500 mg | Freq: Once | INTRAMUSCULAR | Status: DC | PRN

## 2015-09-23 MED ORDER — MENTHOL 3 MG MT LOZG: 1.0000 | LOZENGE | OROMUCOSAL | Status: DC | PRN

## 2015-09-23 MED ORDER — ONDANSETRON HCL 4 MG/2ML IJ SOLN: 4.0000 mg | Freq: Four times a day (QID) | INTRAMUSCULAR | Status: DC | PRN

## 2015-09-23 MED ORDER — BUTORPHANOL TARTRATE 1 MG/ML IJ SOLN: 1.0000 mg | INTRAMUSCULAR | Status: DC | PRN

## 2015-09-23 MED ORDER — SODIUM BICARBONATE 8.4 % IV SOLN
INTRAVENOUS | Status: AC
  Filled 2015-09-23: qty 50

## 2015-09-23 MED ORDER — ONDANSETRON HCL 4 MG/2ML IJ SOLN
INTRAMUSCULAR | Status: DC | PRN
  Administered 2015-09-23: 4 mg via INTRAVENOUS

## 2015-09-23 MED ORDER — FENTANYL CITRATE (PF) 100 MCG/2ML IJ SOLN
INTRAMUSCULAR | Status: DC | PRN
  Administered 2015-09-23: 20 ug via INTRATHECAL

## 2015-09-23 MED ORDER — COCONUT OIL OIL
1.0000 | TOPICAL_OIL | Status: DC | PRN
Start: 2015-09-23 — End: 2015-09-26

## 2015-09-23 MED ORDER — IBUPROFEN 600 MG PO TABS
600.0000 mg | ORAL_TABLET | Freq: Four times a day (QID) | ORAL | Status: DC
  Administered 2015-09-24 – 2015-09-26 (×11): 600 mg via ORAL
  Filled 2015-09-23 (×11): qty 1

## 2015-09-23 MED ORDER — KETOROLAC TROMETHAMINE 30 MG/ML IJ SOLN
INTRAMUSCULAR | Status: AC
  Filled 2015-09-23: qty 1

## 2015-09-23 MED ORDER — MORPHINE SULFATE-NACL 0.5-0.9 MG/ML-% IV SOSY
PREFILLED_SYRINGE | INTRAVENOUS | Status: AC
  Filled 2015-09-23: qty 1

## 2015-09-23 MED ORDER — NALOXONE HCL 0.4 MG/ML IJ SOLN: 0.4000 mg | INTRAMUSCULAR | Status: DC | PRN

## 2015-09-23 MED ORDER — ACETAMINOPHEN 500 MG PO TABS: 1000.0000 mg | ORAL_TABLET | Freq: Four times a day (QID) | ORAL | Status: DC

## 2015-09-23 MED ORDER — KETOROLAC TROMETHAMINE 30 MG/ML IJ SOLN
30.0000 mg | Freq: Four times a day (QID) | INTRAMUSCULAR | Status: DC | PRN
  Administered 2015-09-23: 30 mg via INTRAMUSCULAR

## 2015-09-23 MED ORDER — DEXAMETHASONE SODIUM PHOSPHATE 4 MG/ML IJ SOLN
INTRAMUSCULAR | Status: AC
  Filled 2015-09-23: qty 1

## 2015-09-23 MED ORDER — BUPIVACAINE IN DEXTROSE 0.75-8.25 % IT SOLN
INTRATHECAL | Status: DC | PRN
  Administered 2015-09-23: 1.4 mL via INTRATHECAL

## 2015-09-23 MED ORDER — ZOLPIDEM TARTRATE 5 MG PO TABS
5.0000 mg | ORAL_TABLET | Freq: Every evening | ORAL | Status: DC | PRN
Start: 2015-09-23 — End: 2015-09-26

## 2015-09-23 MED ORDER — LEVETIRACETAM 750 MG PO TABS
750.0000 mg | ORAL_TABLET | Freq: Two times a day (BID) | ORAL | Status: DC
  Administered 2015-09-23 – 2015-09-26 (×6): 750 mg via ORAL
  Filled 2015-09-23 (×8): qty 1

## 2015-09-23 MED ORDER — INSULIN ASPART 100 UNIT/ML ~~LOC~~ SOLN
0.0000 [IU] | Freq: Three times a day (TID) | SUBCUTANEOUS | Status: DC
  Administered 2015-09-24 (×2): 3 [IU] via SUBCUTANEOUS
  Administered 2015-09-25: 2 [IU] via SUBCUTANEOUS

## 2015-09-23 MED ORDER — LACTATED RINGERS IV SOLN: 500.0000 mL | INTRAVENOUS | Status: DC | PRN

## 2015-09-23 MED ORDER — FENTANYL CITRATE (PF) 100 MCG/2ML IJ SOLN
INTRAMUSCULAR | Status: AC
  Filled 2015-09-23: qty 2

## 2015-09-23 MED ORDER — NALBUPHINE HCL 10 MG/ML IJ SOLN: 5.0000 mg | INTRAMUSCULAR | Status: DC | PRN

## 2015-09-23 MED ORDER — WITCH HAZEL-GLYCERIN EX PADS: 1.0000 "application " | MEDICATED_PAD | CUTANEOUS | Status: DC | PRN

## 2015-09-23 MED ORDER — SIMETHICONE 80 MG PO CHEW
80.0000 mg | CHEWABLE_TABLET | ORAL | Status: DC
  Administered 2015-09-24 – 2015-09-26 (×3): 80 mg via ORAL
  Filled 2015-09-23 (×3): qty 1

## 2015-09-23 MED ORDER — OXYTOCIN 40 UNITS IN LACTATED RINGERS INFUSION - SIMPLE MED: 2.5000 [IU]/h | INTRAVENOUS | Status: AC

## 2015-09-23 MED ORDER — OXYTOCIN 10 UNIT/ML IJ SOLN
INTRAMUSCULAR | Status: AC
  Filled 2015-09-23: qty 4

## 2015-09-23 MED ORDER — LACTATED RINGERS IV SOLN
INTRAVENOUS | Status: DC
  Administered 2015-09-24: 01:00:00 via INTRAVENOUS

## 2015-09-23 MED ORDER — OXYTOCIN 40 UNITS IN LACTATED RINGERS INFUSION - SIMPLE MED: 1.0000 m[IU]/min | INTRAVENOUS | Status: DC

## 2015-09-23 MED ORDER — OXYCODONE-ACETAMINOPHEN 5-325 MG PO TABS: 1.0000 | ORAL_TABLET | ORAL | Status: DC | PRN

## 2015-09-23 MED ORDER — LABETALOL HCL 100 MG PO TABS
100.0000 mg | ORAL_TABLET | Freq: Two times a day (BID) | ORAL | Status: DC
  Administered 2015-09-23 – 2015-09-26 (×6): 100 mg via ORAL
  Filled 2015-09-23 (×6): qty 1

## 2015-09-23 MED ORDER — INSULIN ASPART 100 UNIT/ML ~~LOC~~ SOLN
4.0000 [IU] | Freq: Three times a day (TID) | SUBCUTANEOUS | Status: DC
  Administered 2015-09-24 – 2015-09-25 (×4): 4 [IU] via SUBCUTANEOUS

## 2015-09-23 MED ORDER — NALBUPHINE HCL 10 MG/ML IJ SOLN: 5.0000 mg | Freq: Once | INTRAMUSCULAR | Status: DC | PRN

## 2015-09-23 MED ORDER — LACTATED RINGERS IV SOLN: INTRAVENOUS | Status: DC | PRN

## 2015-09-23 MED ORDER — ONDANSETRON HCL 4 MG/2ML IJ SOLN
INTRAMUSCULAR | Status: AC
  Filled 2015-09-23: qty 2

## 2015-09-23 MED ORDER — IBUPROFEN 600 MG PO TABS: 600.0000 mg | ORAL_TABLET | Freq: Four times a day (QID) | ORAL | Status: DC | PRN

## 2015-09-23 MED ORDER — LIDOCAINE-EPINEPHRINE (PF) 2 %-1:200000 IJ SOLN: INTRAMUSCULAR | Status: DC | PRN

## 2015-09-23 MED ORDER — MEPERIDINE HCL 25 MG/ML IJ SOLN: 6.2500 mg | INTRAMUSCULAR | Status: DC | PRN

## 2015-09-23 MED ORDER — LIDOCAINE-EPINEPHRINE (PF) 2 %-1:200000 IJ SOLN
INTRAMUSCULAR | Status: AC
  Filled 2015-09-23: qty 20

## 2015-09-23 MED ORDER — KETOROLAC TROMETHAMINE 30 MG/ML IJ SOLN: 30.0000 mg | Freq: Four times a day (QID) | INTRAMUSCULAR | Status: DC | PRN

## 2015-09-23 MED ORDER — INSULIN ASPART 100 UNIT/ML ~~LOC~~ SOLN: 0.0000 [IU] | SUBCUTANEOUS | Status: DC

## 2015-09-23 MED ORDER — SIMETHICONE 80 MG PO CHEW
80.0000 mg | CHEWABLE_TABLET | Freq: Three times a day (TID) | ORAL | Status: DC
  Administered 2015-09-24 – 2015-09-26 (×7): 80 mg via ORAL
  Filled 2015-09-23 (×7): qty 1

## 2015-09-23 MED ORDER — OXYCODONE-ACETAMINOPHEN 5-325 MG PO TABS: 2.0000 | ORAL_TABLET | ORAL | Status: DC | PRN

## 2015-09-23 MED ORDER — ONDANSETRON HCL 4 MG/2ML IJ SOLN: 4.0000 mg | Freq: Three times a day (TID) | INTRAMUSCULAR | Status: DC | PRN

## 2015-09-23 MED ORDER — SIMETHICONE 80 MG PO CHEW: 80.0000 mg | CHEWABLE_TABLET | ORAL | Status: DC | PRN

## 2015-09-23 MED ORDER — ACETAMINOPHEN 325 MG PO TABS: 650.0000 mg | ORAL_TABLET | ORAL | Status: DC | PRN

## 2015-09-23 MED ORDER — MORPHINE SULFATE (PF) 0.5 MG/ML IJ SOLN
INTRAMUSCULAR | Status: DC | PRN
  Administered 2015-09-23: .2 mg via INTRATHECAL

## 2015-09-23 MED ORDER — OXYTOCIN BOLUS FROM INFUSION: 500.0000 mL | Freq: Once | INTRAVENOUS | Status: DC

## 2015-09-23 MED ORDER — SENNOSIDES-DOCUSATE SODIUM 8.6-50 MG PO TABS
2.0000 | ORAL_TABLET | ORAL | Status: DC
  Administered 2015-09-24 – 2015-09-26 (×3): 2 via ORAL
  Filled 2015-09-23 (×3): qty 2

## 2015-09-23 MED ORDER — TETANUS-DIPHTH-ACELL PERTUSSIS 5-2.5-18.5 LF-MCG/0.5 IM SUSP: 0.5000 mL | Freq: Once | INTRAMUSCULAR | Status: DC

## 2015-09-23 MED ORDER — CEFAZOLIN SODIUM-DEXTROSE 2-3 GM-% IV SOLR
INTRAVENOUS | Status: DC | PRN
  Administered 2015-09-23: 2 g via INTRAVENOUS

## 2015-09-23 MED ORDER — SODIUM CHLORIDE 0.9% FLUSH: 3.0000 mL | INTRAVENOUS | Status: DC | PRN

## 2015-09-23 MED ORDER — LIDOCAINE HCL (PF) 1 % IJ SOLN: 30.0000 mL | INTRAMUSCULAR | Status: DC | PRN

## 2015-09-23 MED ORDER — DIPHENHYDRAMINE HCL 50 MG/ML IJ SOLN: 12.5000 mg | INTRAMUSCULAR | Status: DC | PRN

## 2015-09-23 MED ORDER — SOD CITRATE-CITRIC ACID 500-334 MG/5ML PO SOLN
30.0000 mL | ORAL | Status: DC | PRN
  Administered 2015-09-23: 30 mL via ORAL
  Filled 2015-09-23: qty 15

## 2015-09-23 MED ORDER — LACTATED RINGERS IV SOLN
INTRAVENOUS | Status: DC
  Administered 2015-09-23 (×3): via INTRAVENOUS

## 2015-09-23 MED ORDER — OXYTOCIN 10 UNIT/ML IJ SOLN
INTRAVENOUS | Status: DC | PRN
  Administered 2015-09-23: 40 [IU] via INTRAVENOUS

## 2015-09-23 MED ORDER — DIPHENHYDRAMINE HCL 25 MG PO CAPS: 25.0000 mg | ORAL_CAPSULE | ORAL | Status: DC | PRN

## 2015-09-23 MED ORDER — ACETAMINOPHEN 325 MG PO TABS
650.0000 mg | ORAL_TABLET | ORAL | Status: DC | PRN
  Administered 2015-09-24 – 2015-09-25 (×3): 650 mg via ORAL
  Filled 2015-09-23 (×3): qty 2

## 2015-09-23 MED ORDER — FENTANYL CITRATE (PF) 100 MCG/2ML IJ SOLN: 25.0000 ug | INTRAMUSCULAR | Status: DC | PRN

## 2015-09-23 MED ORDER — NALOXONE HCL 2 MG/2ML IJ SOSY: 1.0000 ug/kg/h | PREFILLED_SYRINGE | INTRAVENOUS | Status: DC | PRN

## 2015-09-23 MED ORDER — DIBUCAINE 1 % RE OINT: 1.0000 "application " | TOPICAL_OINTMENT | RECTAL | Status: DC | PRN

## 2015-09-23 SURGICAL SUPPLY — 37 items
BARRIER ADHS 3X4 INTERCEED (GAUZE/BANDAGES/DRESSINGS) IMPLANT
BENZOIN TINCTURE PRP APPL 2/3 (GAUZE/BANDAGES/DRESSINGS) ×3 IMPLANT
CHLORAPREP W/TINT 26ML (MISCELLANEOUS) ×3 IMPLANT
CLAMP CORD UMBIL (MISCELLANEOUS) IMPLANT
CLOSURE WOUND 1/2 X4 (GAUZE/BANDAGES/DRESSINGS) ×1
CLOTH BEACON ORANGE TIMEOUT ST (SAFETY) ×3 IMPLANT
CONTAINER PREFILL 10% NBF 15ML (MISCELLANEOUS) IMPLANT
DRSG OPSITE POSTOP 4X10 (GAUZE/BANDAGES/DRESSINGS) ×3 IMPLANT
ELECT REM PT RETURN 9FT ADLT (ELECTROSURGICAL) ×3
ELECTRODE REM PT RTRN 9FT ADLT (ELECTROSURGICAL) ×1 IMPLANT
EXTRACTOR VACUUM M CUP 4 TUBE (SUCTIONS) IMPLANT
EXTRACTOR VACUUM M CUP 4' TUBE (SUCTIONS)
GLOVE BIO SURGEON STRL SZ 6.5 (GLOVE) ×2 IMPLANT
GLOVE BIO SURGEONS STRL SZ 6.5 (GLOVE) ×1
GLOVE BIOGEL PI IND STRL 7.0 (GLOVE) ×1 IMPLANT
GLOVE BIOGEL PI INDICATOR 7.0 (GLOVE) ×2
GOWN STRL REUS W/TWL LRG LVL3 (GOWN DISPOSABLE) ×6 IMPLANT
KIT ABG SYR 3ML LUER SLIP (SYRINGE) IMPLANT
NEEDLE HYPO 22GX1.5 SAFETY (NEEDLE) IMPLANT
NEEDLE HYPO 25X5/8 SAFETYGLIDE (NEEDLE) ×3 IMPLANT
NS IRRIG 1000ML POUR BTL (IV SOLUTION) ×3 IMPLANT
PACK C SECTION WH (CUSTOM PROCEDURE TRAY) ×3 IMPLANT
PAD ABD 8X7 1/2 STERILE (GAUZE/BANDAGES/DRESSINGS) ×3 IMPLANT
PAD OB MATERNITY 4.3X12.25 (PERSONAL CARE ITEMS) ×3 IMPLANT
PENCIL SMOKE EVAC W/HOLSTER (ELECTROSURGICAL) ×3 IMPLANT
SPONGE GAUZE 4X4 12PLY STER LF (GAUZE/BANDAGES/DRESSINGS) ×3 IMPLANT
STRIP CLOSURE SKIN 1/2X4 (GAUZE/BANDAGES/DRESSINGS) ×2 IMPLANT
SUT CHROMIC 0 CTX 36 (SUTURE) ×6 IMPLANT
SUT PLAIN 0 NONE (SUTURE) IMPLANT
SUT PLAIN 2 0 XLH (SUTURE) IMPLANT
SUT VIC AB 0 CT1 27 (SUTURE) ×6
SUT VIC AB 0 CT1 27XBRD ANBCTR (SUTURE) ×3 IMPLANT
SUT VIC AB 4-0 KS 27 (SUTURE) IMPLANT
SYR CONTROL 10ML LL (SYRINGE) IMPLANT
TAPE HYPAFIX 4 X10 (GAUZE/BANDAGES/DRESSINGS) ×3 IMPLANT
TOWEL OR 17X24 6PK STRL BLUE (TOWEL DISPOSABLE) ×3 IMPLANT
TRAY FOLEY CATH SILVER 14FR (SET/KITS/TRAYS/PACK) ×3 IMPLANT

## 2015-09-23 NOTE — ED Notes (Signed)
Patient sent to MAU to be admitted for IUGR

## 2015-09-23 NOTE — MAU Note (Signed)
Pt sent from office for IUGR.

## 2015-09-23 NOTE — Anesthesia Pain Management Evaluation Note (Signed)
  [facility] Pain Management Visit Note  Patient: Laurie Trujillo, 40 y.o., female  "Hello I am a member of the anesthesia team at [facility]. We have an anesthesia team available at all times to provide care throughout the hospital, including epidural management and anesthesia for C-section. I don't know your plan for the delivery whether it a natural birth, water birth, IV sedation, nitrous supplementation, doula or epidural, but we want to meet your pain goals."   1.Was your pain managed to your expectations on prior hospitalizations?   No prior hospitalizations  2.What is your expectation for pain management during this hospitalization?     Epidural  3.How can we help you reach that goal? epidrual  Record the patient's initial score and the patient's pain goal.   Pain: 0  Pain Goal: 5 The [facility] wants you to be able to say your pain was always managed very well.  Ventura Leggitt 09/23/2015

## 2015-09-23 NOTE — Progress Notes (Signed)
Received 1 dose of Cytotec.  FHR strip reviewed - periods of late decelerations noted with the intermittent contractions and variability diminished over past 30 minutes  Cervix is unchanged  Vertex is floating  Given that she is very remote from vaginal delivery and late decelerations occurring with mild intermittent contractions I do not think the baby will tolerate labor  Recommend Primary LTCS  Risks reviewed with patient Consent signed OR Notified NICU at Delivery

## 2015-09-23 NOTE — Transfer of Care (Signed)
Immediate Anesthesia Transfer of Care Note  Patient: Lunamarie Lyu  Procedure(s) Performed: Procedure(s): CESAREAN SECTION (N/A)  Patient Location: PACU  Anesthesia Type:Spinal  Level of Consciousness: awake, alert  and oriented  Airway & Oxygen Therapy: Patient Spontanous Breathing  Post-op Assessment: Report given to RN and Post -op Vital signs reviewed and stable  Post vital signs: Reviewed and stable  Last Vitals:  Vitals:   09/23/15 1549 09/23/15 1819  BP: 140/78 (!) 156/78  Pulse: 91 82  Resp:  13  Temp:      Last Pain:  Vitals:   09/23/15 1548  TempSrc: Oral  PainSc: 1       Patients Stated Pain Goal: 5 (09/23/15 1042)  Complications: No apparent anesthesia complications

## 2015-09-23 NOTE — H&P (Signed)
Laurie Trujillo is a 40 y.o. G l1 P k0 at 38 w 2 days admitted for induction.   Pregnancy complicated by: AMA Complex partial seizures on Keppra Diabetes mellitus - on insulin Chronic Hypertension - on Labetalol  She was evaluated in MFM by Dr. Claudean Severance today and baby found to be IUGR with abnormal Dopplers. He recommended induction today. NST Reactive in triage - Category 1. OB History    Gravida Para Term Preterm AB Living   1             SAB TAB Ectopic Multiple Live Births                 Past Medical History:  Diagnosis Date  . AMA (advanced maternal age) primigravida 35+   . Complex partial seizures (HCC)   . Diabetes mellitus without complication (HCC)   . Headache(784.0)   . Hypertension   . Newborn product of IVF pregnancy   . Obesities, morbid (HCC)   . Other forms of epilepsy and recurrent seizures without mention of intractable epilepsy 12/07/2012  . Seizures (HCC)    Past Surgical History:  Procedure Laterality Date  . TONSILLECTOMY    . WISDOM TOOTH EXTRACTION     Family History: family history includes Cancer in her maternal grandmother; Diabetes in her maternal uncle and mother; Diabetic kidney disease in her maternal grandmother, maternal uncle, and mother; High blood pressure in her maternal grandmother and paternal grandmother; Hyperlipidemia in her father; Hypertension in her maternal grandfather and maternal grandmother. Social History:  reports that she has never smoked. She has never used smokeless tobacco. She reports that she does not drink alcohol or use drugs.     Maternal Diabetes: Yes:  Diabetes Type:  Insulin/Medication controlled Genetic Screening: Normal Maternal Ultrasounds/Referrals: Normal Fetal Ultrasounds or other Referrals:  Referred to Materal Fetal Medicine IUGR Maternal Substance Abuse:  No  Significant Maternal Medications:  Meds include: Other:  Insulin and labetalol Significant Maternal Lab Results:  None Other Comments:   None  Review of Systems  All other systems reviewed and are negative.  Maternal Medical History:  Prenatal complications: PIH and IUGR.   Prenatal Complications - Diabetes: type 2. Diabetes is managed by insulin injections.      Dilation: Fingertip Effacement (%): 30 Station: Ballotable Exam by:: Jorita Bohanon Blood pressure 130/78, pulse 100, temperature 98.2 F (36.8 C), temperature source Oral, resp. rate 16, height 5\' 1"  (1.549 m), weight 215 lb 4 oz (97.6 kg). Maternal Exam:  Abdomen: Fetal presentation: vertex     Fetal Exam Fetal State Assessment: Category I - tracings are normal.     Physical Exam  Nursing note and vitals reviewed. Constitutional: She appears well-developed.  HENT:  Head: Normocephalic.  Eyes: Pupils are equal, round, and reactive to light.  Neck: Normal range of motion.  Cardiovascular: Normal rate and regular rhythm.   Respiratory: Effort normal.    Prenatal labs: ABO, Rh: O/Positive/-- (01/04 0000) Antibody: Negative (01/04 0000) Rubella: Immune (01/04 0000) RPR: Nonreactive (01/04 0000)  HBsAg: Negative (01/04 0000)  HIV: Non-reactive (01/04 0000)  GBS:     Assessment/Plan: IUP at 38 w 2 days IUGR with abnormal dopplers Chronic Hypertension Diabetes Mellitus History of Seizures Admit for 2 stage induction Sliding scale insulin Continue Keppra and Labetalol Patient and spouse advised if fetus does not tolerate labor will proceed to C Section  Mae Cianci L 09/23/2015, 10:05 AM

## 2015-09-23 NOTE — Brief Op Note (Signed)
09/23/2015  6:03 PM  PATIENT:  Laurie Trujillo  40 y.o. female  PRE-OPERATIVE DIAGNOSIS:  IUP at 38 2/7 IUGR Abnormal Dopplers Chronic Hypertension Diabetes Seizure Disorder Non Reassuring FHR Pattern - Fetal intolerance to Labor  POST-OPERATIVE DIAGNOSIS:   Same  PROCEDURE:  Procedure(s): CESAREAN SECTION (N/A)  SURGEON:  Surgeon(s) and Role:    * Marcelle Overlie, MD - Primary  PHYSICIAN ASSISTANT:   ASSISTANTS: none   ANESTHESIA:   spinal  EBL:  Total I/O In: 300 [I.V.:300] Out: 1050 [Urine:50; Blood:1000]  BLOOD ADMINISTERED:none  DRAINS: Urinary Catheter (Foley)   LOCAL MEDICATIONS USED:  NONE  SPECIMEN:  Source of Specimen:  placenta  DISPOSITION OF SPECIMEN:  PATHOLOGY  COUNTS:  YES  TOURNIQUET:  * No tourniquets in log *  DICTATION: .Other Dictation: Dictation Number [id number]  PLAN OF CARE: Admit to inpatient   PATIENT DISPOSITION:  PACU - hemodynamically stable.   Delay start of Pharmacological VTE agent (>24hrs) due to surgical blood loss or risk of bleeding: not applicable

## 2015-09-23 NOTE — Op Note (Signed)
Laurie Trujillo, Laurie Trujillo                ACCOUNT NO.:  [id number]  MEDICAL RECORD NO.:  [id number]  LOCATION:  9105                          FACILITY:  [facility]  PHYSICIAN:  Nadelyn Enriques L. Gricelda Foland, M.D.DATE OF BIRTH:  26-Aug-1975  DATE OF PROCEDURE:  09/23/2015 DATE OF DISCHARGE:                              OPERATIVE REPORT   PREOPERATIVE DIAGNOSIS:  IUP at 38 weeks and 2 days and IUGR and abnormal Dopplers and nonreassuring fetal heart rate in chronic hypertension and diabetes mellitus and seizure disorder.  POSTOPERATIVE DIAGNOSIS:  IUP at 38 weeks and 2 days and IUGR and abnormal Dopplers and nonreassuring fetal heart rate in chronic hypertension and diabetes mellitus and seizure disorder.  PROCEDURE:  Primary low transverse cesarean section.  SURGEON:  Pattijo Juste L. Vincente Poli, M.D.  ANESTHESIA:  Spinal.  ESTIMATED BLOOD LOSS:  1000 mL.  COMPLICATIONS:  None.  DRAINS:  Foley.  PROCEDURE IN DETAIL:  The patient was taken to the operating room. Anesthesia was administered.  She was prepped and draped in the usual sterile fashion.  A Foley catheter was inserted.  A low transverse incision was made, carried down to the fascia.  Fascia was scored in the midline and extended laterally.  The rectus muscles were separated in the midline.  The peritoneum was entered bluntly.  The peritoneal incision was then stretched.  The bladder blade was inserted, and the low transverse incision was made in the uterus.  The uterus was entered using a hemostat.  The amniotic fluid was noted to be clear after a low- transverse incision was made.  The baby was in cephalic presentation, was delivered easily with a vacuum extractor.  The baby was definitely IUGR, and the cord was very thin and tenuous.  After the cord was clamped and cut, the baby was handed to the waiting neonatal team.  The placenta was manually removed, noted to be intact with a three-vessel cord.  It was sent to Pathology.  The uterus was  exteriorized and cleared of all clots and debris.  The uterus was firm immediately and was closed in 2 layers using 0 chromic in a running locked stitch.  The uterus was returned to the abdomen.  Irrigation was performed.  The peritoneum was closed using 0 Vicryl.  The fascia was closed using 0 Vicryl starting each corner meeting in the midline.  After irrigation of subcutaneous layer, the skin was closed with 4-0 Vicryl on a Keith needle.  Benzoin, Steri- Strips, a honeycomb dressing, and a pressure dressing were applied.  All sponge, lap, and instrument counts were correct x2.     Madisun Hargrove L. Vincente Poli, M.D.     Florestine Avers  D:  09/23/2015  T:  09/23/2015  Job:  [id number]

## 2015-09-23 NOTE — Anesthesia Procedure Notes (Signed)
Spinal  Patient location during procedure: OR Start time: 09/23/2015 5:11 PM Staffing Anesthesiologist: Mal Amabile Performed: anesthesiologist  Preanesthetic Checklist Completed: patient identified, site marked, surgical consent, pre-op evaluation, timeout performed, IV checked, risks and benefits discussed and monitors and equipment checked Spinal Block Patient position: sitting Prep: site prepped and draped and DuraPrep Patient monitoring: heart rate, cardiac monitor, continuous pulse ox and blood pressure Approach: midline Location: L3-4 Injection technique: single-shot Needle Needle type: Pencan  Needle gauge: 24 G Needle length: 9 cm Needle insertion depth: 6 cm Assessment Sensory level: T4 Additional Notes Patient tolerated procedure well. Adequate sensory level.

## 2015-09-23 NOTE — Anesthesia Preprocedure Evaluation (Signed)
Anesthesia Evaluation  Patient identified by MRN, date of birth, ID band Patient awake    Reviewed: Allergy & Precautions, NPO status , Patient's Chart, lab work & pertinent test results, reviewed documented beta blocker date and time   Airway Mallampati: III  TM Distance: >3 FB Neck ROM: Full    Dental no notable dental hx. (+) Teeth Intact   Pulmonary neg pulmonary ROS,    Pulmonary exam normal breath sounds clear to auscultation       Cardiovascular hypertension, Pt. on medications and Pt. on home beta blockers Normal cardiovascular exam Rhythm:Regular Rate:Normal     Neuro/Psych  Headaches, Seizures -, Well Controlled,  Hx/o Complex partial seizures negative psych ROS   GI/Hepatic Neg liver ROS, GERD  Medicated and Controlled,  Endo/Other  diabetes, Well Controlled, Type 2, Insulin DependentMorbid obesity  Renal/GU negative Renal ROS  negative genitourinary   Musculoskeletal negative musculoskeletal ROS (+)   Abdominal (+) + obese,   Peds  Hematology negative hematology ROS (+)   Anesthesia Other Findings   Reproductive/Obstetrics (+) Pregnancy Hx/o infertility IVF Non reassuring FHR tracing                             Anesthesia Physical Anesthesia Plan  ASA: III and emergent  Anesthesia Plan: Spinal   Post-op Pain Management:    Induction: Intravenous  Airway Management Planned: Natural Airway  Additional Equipment:   Intra-op Plan:   Post-operative Plan:   Informed Consent: I have reviewed the patients History and Physical, chart, labs and discussed the procedure including the risks, benefits and alternatives for the proposed anesthesia with the patient or authorized representative who has indicated his/her understanding and acceptance.   Dental advisory given  Plan Discussed with: CRNA, Anesthesiologist and Surgeon  Anesthesia Plan Comments:          Anesthesia Quick Evaluation

## 2015-09-24 ENCOUNTER — Ambulatory Visit (HOSPITAL_COMMUNITY): Payer: BLUE CROSS/BLUE SHIELD

## 2015-09-24 ENCOUNTER — Encounter (HOSPITAL_COMMUNITY): Payer: Self-pay | Admitting: Obstetrics and Gynecology

## 2015-09-24 ENCOUNTER — Ambulatory Visit: Payer: BLUE CROSS/BLUE SHIELD | Admitting: Neurology

## 2015-09-24 LAB — RPR: RPR: NONREACTIVE

## 2015-09-24 LAB — CBC
HCT: 30.7 % — ABNORMAL LOW (ref 36.0–46.0)
HEMATOCRIT: 32.4 % — AB (ref 36.0–46.0)
HEMOGLOBIN: 11.3 g/dL — AB (ref 12.0–15.0)
HEMOGLOBIN: 10.5 g/dL — AB (ref 12.0–15.0)
MCH: 28.7 pg (ref 26.0–34.0)
MCH: 28.4 pg (ref 26.0–34.0)
MCHC: 34.9 g/dL (ref 30.0–36.0)
MCHC: 34.2 g/dL (ref 30.0–36.0)
MCV: 82.2 fL (ref 78.0–100.0)
MCV: 83 fL (ref 78.0–100.0)
PLATELETS: 269 10*3/uL (ref 150–400)
Platelets: 273 10*3/uL (ref 150–400)
RBC: 3.94 MIL/uL (ref 3.87–5.11)
RBC: 3.7 MIL/uL — AB (ref 3.87–5.11)
RDW: 14.1 % (ref 11.5–15.5)
RDW: 14.3 % (ref 11.5–15.5)
WBC: 14.4 10*3/uL — ABNORMAL HIGH (ref 4.0–10.5)
WBC: 15 10*3/uL — AB (ref 4.0–10.5)

## 2015-09-24 LAB — GLUCOSE, CAPILLARY
GLUCOSE-CAPILLARY: 103 mg/dL — AB (ref 65–99)
GLUCOSE-CAPILLARY: 131 mg/dL — AB (ref 65–99)
GLUCOSE-CAPILLARY: 156 mg/dL — AB (ref 65–99)
Glucose-Capillary: 136 mg/dL — ABNORMAL HIGH (ref 65–99)
Glucose-Capillary: 197 mg/dL — ABNORMAL HIGH (ref 65–99)
Glucose-Capillary: 116 mg/dL — ABNORMAL HIGH (ref 65–99)

## 2015-09-24 LAB — RUBELLA SCREEN: RUBELLA: 19.8 {index} (ref >0.99)

## 2015-09-24 NOTE — Plan of Care (Signed)
Problem: Nutritional: Goal: Mother's verbalization of comfort with breastfeeding process will improve Outcome: Not Applicable Date Met: 20/35/59 Bottlefeeding

## 2015-09-24 NOTE — Anesthesia Postprocedure Evaluation (Signed)
Anesthesia Post Note  Patient: Laurie Trujillo  Procedure(s) Performed: Procedure(s) (LRB): CESAREAN SECTION (N/A)  Patient location during evaluation: Mother Baby Anesthesia Type: Spinal Level of consciousness: oriented and awake and alert Pain management: pain level controlled Vital Signs Assessment: post-procedure vital signs reviewed and stable Respiratory status: spontaneous breathing and respiratory function stable Cardiovascular status: blood pressure returned to baseline and stable Postop Assessment: no headache and no backache Anesthetic complications: no     Last Vitals:  Vitals:   09/24/15 0400 09/24/15 0905  BP: (!) 154/67 (!) 135/59  Pulse: 96 90  Resp: 18   Temp: 37.1 C     Last Pain:  Vitals:   09/24/15 0911  TempSrc:   PainSc: 1    Pain Goal: Patients Stated Pain Goal: 2 (09/24/15 0911)               Junious Silk

## 2015-09-24 NOTE — Addendum Note (Signed)
Addendum  created 09/24/15 0947 by Junious Silk, CRNA   Sign clinical note

## 2015-09-24 NOTE — Progress Notes (Signed)
Pressure dressing noted since arrival to unit from PACU to be stain marked on left lower margin. Assessment at 0000 appears to be more saturated and honeycomb underneath is peeling back and wet with bloody drainage. Dr. Vincente Poli called and order to change dressing given. Will continue to closely monitor.

## 2015-09-24 NOTE — Anesthesia Postprocedure Evaluation (Signed)
Anesthesia Post Note  Patient: Laurie Trujillo  Procedure(s) Performed: Procedure(s) (LRB): CESAREAN SECTION (N/A)  Patient location during evaluation: PACU Anesthesia Type: Spinal Level of consciousness: awake and alert and oriented Pain management: pain level controlled Vital Signs Assessment: post-procedure vital signs reviewed and stable Respiratory status: spontaneous breathing, nonlabored ventilation and respiratory function stable Cardiovascular status: blood pressure returned to baseline and stable Postop Assessment: no headache, no backache, spinal receding, patient able to bend at knees and no signs of nausea or vomiting Anesthetic complications: no                  Tanikka Bresnan A.

## 2015-09-24 NOTE — Progress Notes (Signed)
Honeycomb dressing changed per sterile technique.

## 2015-09-24 NOTE — Progress Notes (Signed)
At 1700 patient's dressing had not changed in drainage only about 40 percent saturated with light pink serous drainage. No active bleeding note. It seems as if the drainage has slowed down since the last honeycomb change. Will monitor.

## 2015-09-24 NOTE — Progress Notes (Signed)
Dr. Marcelle Overlie notified of patient's incision status. No new drainage noted. He was also notified of CBG of 197 and stated to use the sliding scale insulin for coverage. 3 units given. Foley dc'd per order. Patient tolerated well.

## 2015-09-24 NOTE — Progress Notes (Signed)
Dr. Marcelle Overlie aware of new dressing that was changed this afternoon was already 50 percent saturated. He stated to keep changing honeycomb as needed until the oozing stops and to do a CBC. Will change dressing again per order.

## 2015-09-24 NOTE — Progress Notes (Signed)
Subjective: Postpartum Day 1: Cesarean Delivery Patient reports tolerating PO and + flatus.    Objective: Vital signs in last 24 hours: Temp:  [97.9 F (36.6 C)-99.2 F (37.3 C)] 98.8 F (37.1 C) (07/25 0400) Pulse Rate:  [66-102] 96 (07/25 0400) Resp:  [13-30] 18 (07/25 0400) BP: (130-156)/(66-93) 154/67 (07/25 0400) SpO2:  [96 %-100 %] 97 % (07/25 0400) Weight:  [215 lb (97.5 kg)-215 lb 4 oz (97.6 kg)] 215 lb (97.5 kg) (07/24 1042)  Physical Exam:  General: alert and cooperative Lochia: appropriate Uterine Fundus: firm Incision: bloody fluid from left margin of incision. Steristrips reapplied and honeycomb dressing reapplied DVT Evaluation: No evidence of DVT seen on physical exam. Negative Homan's sign. No cords or calf tenderness.   Recent Labs  09/23/15 1040 09/24/15 0557  HGB 13.4 11.3*  HCT 37.7 32.4*    Assessment/Plan: Status post Cesarean section. Postoperative course complicated by [facility], incisional drainage  observe BP , call for diastolic pressure < 60. Observe incision.   Donovon Micheletti G 09/24/2015, 8:04 AM

## 2015-09-24 NOTE — Progress Notes (Signed)
Julio Sicks came in conference room around [phone] to ask for help with changing a dressing. Left side of pressure dressing was oozing underneath. Okey Regal took dressing off left side of incision was actively oozing she applied some pressure and new steri steri strips applied  on left side of incision. 4 x4 sterile gauze placed over oozing steri strips and new honeycomb applied. Slight oozing noted right after placement. Okey Regal aware of new drainage, will  Monitor. Patient's CBG was 136. Okey Regal aware and stated just to give the 4 units of insulin Novolog and recheck in 2 hours after meal. C. Lyda Jester stated to hold patient's labetalol if diastolic was less that 60. Patient's diastolic was 59 , so her 10 am Labetalol was held. Patient was informed to let nurse know is she felt bad at any time today. Okey Regal stated to keep Foley in for now . See nursing care order instruction orders. Will monitor patient.

## 2015-09-24 NOTE — Progress Notes (Signed)
Laurie Trujillo notified of dressing being 60 percent saturated with light pink colored drainage. Stated to change honeycomb again.

## 2015-09-25 LAB — GLUCOSE, CAPILLARY
Glucose-Capillary: 141 mg/dL — ABNORMAL HIGH (ref 65–99)
Glucose-Capillary: 93 mg/dL (ref 65–99)

## 2015-09-25 MED ORDER — INSULIN ASPART 100 UNIT/ML ~~LOC~~ SOLN
0.0000 [IU] | Freq: Three times a day (TID) | SUBCUTANEOUS | Status: DC
  Administered 2015-09-25: 2 [IU] via SUBCUTANEOUS

## 2015-09-25 MED ORDER — INSULIN NPH (HUMAN) (ISOPHANE) 100 UNIT/ML ~~LOC~~ SUSP
5.0000 [IU] | Freq: Two times a day (BID) | SUBCUTANEOUS | Status: DC
  Administered 2015-09-26 (×2): 5 [IU] via SUBCUTANEOUS
  Filled 2015-09-25: qty 10

## 2015-09-25 MED ORDER — INSULIN ASPART 100 UNIT/ML ~~LOC~~ SOLN
3.0000 [IU] | Freq: Three times a day (TID) | SUBCUTANEOUS | Status: DC
  Administered 2015-09-25 – 2015-09-26 (×3): 3 [IU] via SUBCUTANEOUS

## 2015-09-25 MED ORDER — INSULIN ASPART 100 UNIT/ML ~~LOC~~ SOLN: 0.0000 [IU] | Freq: Every day | SUBCUTANEOUS | Status: DC

## 2015-09-25 NOTE — Progress Notes (Signed)
Subjective: Postpartum Day 2: Cesarean Delivery Patient reports tolerating PO, + flatus, + BM and no problems voiding.    Objective: Vital signs in last 24 hours: Temp:  [98.3 F (36.8 C)-98.8 F (37.1 C)] 98.6 F (37 C) (07/26 0600) Pulse Rate:  [90-102] 102 (07/26 0600) Resp:  [18] 18 (07/26 0600) BP: (112-147)/(53-79) 138/79 (07/26 0600)  Physical Exam:  General: alert and cooperative Lochia: appropriate Uterine Fundus: firm Incision: healing well, honeycomb dressing replaced this am, no further oozing noted, honeycomb dressing replaced DVT Evaluation: No evidence of DVT seen on physical exam. Negative Homan's sign. No cords or calf tenderness. No significant calf/ankle edema.   Recent Labs  09/24/15 0557 09/24/15 1649  HGB 11.3* 10.5*  HCT 32.4* 30.7*    Assessment/Plan: Status post Cesarean section. Postoperative course complicated by gestational diabetes  Consult with diabetes coordinator.  Laurie Trujillo G 09/25/2015, 8:15 AM

## 2015-09-25 NOTE — Progress Notes (Addendum)
Inpatient Diabetes Program Recommendations  AACE/ADA: New Consensus Statement on Inpatient Glycemic Control (2015)  Target Ranges:  Prepandial:   less than 140 mg/dL      Peak postprandial:   less than 180 mg/dL (1-2 hours)      Critically ill patients:  140 - 180 mg/dL   Results for Laurie Trujillo, Laurie Trujillo (MRN [id number]) as of 09/25/2015 06:56  Ref. Range 09/24/2015 08:23 09/24/2015 10:17 09/24/2015 13:05 09/24/2015 19:04 09/24/2015 23:32  Glucose-Capillary Latest Ref Range: 65 - 99 mg/dL 056 (H) 979 (H) 480 (H) 156 (H) 131 (H)   Review of Glycemic Control   Current orders for Inpatient glycemic control: Novolog 0-15 units TID with meals, Novolog 4 units TID with meals  Inpatient Diabetes Program Recommendations:  Outpatient Plan for DM control: Patient has a history of DM2. Patient states that she has had good glycemic control with NPH and Humalog and prefers to use NPH and Humalog at time of discharge if she will be discharged on insulin. In reviewing glucose trends, patient has not required much insulin and glucose has ranged from 116-197 mg/dl over the past 24 hours and she has received a total of Novolog 24 units (12 units for meal coverage and 12 units for correction).  If MD plans to discharge patient on insulin may want to consider ordering NPH 5 units BID (to start now), decrease meal coverage to 3 units TID and decrease correction scale to sensitive scale 0-9 units. Would recommend having patient follow up with PCP regarding glycemic control within a week from discharge.  Talked with patient over the phone and she reports that prior to pregnancy she was followed by her PCP for diabetes control and that she was taking Glipizide and [place]. Patient reports that she was switched to NPH and Humalog during pregnancy and reports good glycemic control. Patient states that she would prefer to use NPH and Humalog for glycemic control as an outpatient and she reports that she has a good supply of both NPH and  Humalog at home. Discussed glycemic control and glucose goals. Patient plans to follow up with PCP regarding glycemic control. If patient will be discharged on insulin, would recommend using NPH and Humalog as patient has requested and since she has a good supply of both insulins at home. Advised patient that if she is having any issues with hypoglycemia or consistently hyperglycemia as an outpatient she needs to contact her PCP for further recommendations with insulin adjustments.   Thanks, Orlando Penner, RN, MSN, CDE Diabetes Coordinator Inpatient Diabetes Program [phone] (Team Pager from 8am to 5pm) [phone] ([place] office) [phone] [facility] office) [phone] [facility] office)

## 2015-09-26 LAB — GLUCOSE, CAPILLARY
GLUCOSE-CAPILLARY: 99 mg/dL (ref 65–99)
Glucose-Capillary: 154 mg/dL — ABNORMAL HIGH (ref 65–99)
Glucose-Capillary: 164 mg/dL — ABNORMAL HIGH (ref 65–99)

## 2015-09-26 MED ORDER — INSULIN ASPART 100 UNIT/ML ~~LOC~~ SOLN: 0.0000 [IU] | Freq: Every day | SUBCUTANEOUS | 11 refills | Status: DC

## 2015-09-26 MED ORDER — INSULIN ASPART 100 UNIT/ML ~~LOC~~ SOLN: 0.0000 [IU] | Freq: Three times a day (TID) | SUBCUTANEOUS | 11 refills | Status: DC

## 2015-09-26 MED ORDER — IBUPROFEN 600 MG PO TABS: 600.0000 mg | ORAL_TABLET | Freq: Four times a day (QID) | ORAL | 1 refills | Status: DC

## 2015-09-26 MED ORDER — INSULIN ASPART 100 UNIT/ML ~~LOC~~ SOLN: 3.0000 [IU] | Freq: Three times a day (TID) | SUBCUTANEOUS | 11 refills | Status: DC

## 2015-09-26 NOTE — Progress Notes (Addendum)
Inpatient Diabetes Program Recommendations  AACE/ADA: New Consensus Statement on Inpatient Glycemic Control (2015)  Target Ranges:  Prepandial:   less than 140 mg/dL      Peak postprandial:   less than 180 mg/dL (1-2 hours)      Critically ill patients:  140 - 180 mg/dL   Results for SHRON, BODE (MRN [id number]) as of 09/26/2015 07:50  Ref. Range 09/24/2015 23:32 09/25/2015 08:52 09/25/2015 12:36  Glucose-Capillary Latest Ref Range: 65 - 99 mg/dL 208 (H) 022 (H) 93   Review of Glycemic Control  Current orders for Inpatient glycemic control: NPH 5 units BID, Novolog 3 units TID with meals, Novolog 0-9 units TID with meals, Novolog 0-5 units QHS  Outpatient Plan for Diabetes Control:No glucose values in chart since yesterday at 12:36. Talked with Toma Copier, RN this morning and she states patient's fasting glucose is 99 mg/dl this morning. Recommend discharging patient on NPH 5 units BID and Humalog 3 units TID with meals. Also, advise patient to follow up with PCP regarding glycemic control, check glucose 3-4 times per day (before meals and at bedtime), and to contact her PCP if she experiences any issues with hypoglycemia or if her glucose is consistently greater than 180 mg/dl.  Talked with patient over the phone and explained recommendations for NPH and Humalog and informed patient it would be up to discharging doctor to prescribe if they agree with recommendations.  Encouraged patient to check glucose 3-4 times per day, make PCP appointment for next week, and to call her PCP if she experiences any hypoglycemia or if glucose is consistently greater than 180 mg/dl. Patient states that she was already planning to call PCP today and try to get an appointment early next week.    Thanks, Orlando Penner, RN, MSN, CDE Diabetes Coordinator Inpatient Diabetes Program [phone] (Team Pager from 8am to 5pm) [phone] ([place] office) [phone] [facility] office) [phone] [facility] office)

## 2015-09-26 NOTE — Discharge Summary (Signed)
Obstetric Discharge Summary Reason for Admission: induction of labor Prenatal Procedures: ultrasound Intrapartum Procedures: cesarean: low cervical, transverse Postpartum Procedures: none Complications-Operative and Postpartum: none Hemoglobin  Date Value Ref Range Status  09/24/2015 10.5 (L) 12.0 - 15.0 Trujillo/dL Final   HCT  Date Value Ref Range Status  09/24/2015 30.7 (L) 36.0 - 46.0 % Final    Physical Exam:  General: alert and cooperative Lochia: appropriate Uterine Fundus: firm Incision: healing well DVT Evaluation: No evidence of DVT seen on physical exam. Negative Homan's sign. No cords or calf tenderness. No significant calf/ankle edema.  Discharge Diagnoses: Term Pregnancy-delivered  Discharge Information: Date: 09/26/2015 Activity: pelvic rest Diet: routine Medications: PNV, Ibuprofen and labaetalol, Insulin Condition: stable Instructions: refer to practice specific booklet Discharge to: home   Newborn Data: Live born female  Birth Weight: 4 lb 7.8 oz (2035 Trujillo) APGAR: 8, 8  Home with mother.  Laurie Trujillo 09/26/2015, 8:33 AM

## 2015-09-27 ENCOUNTER — Inpatient Hospital Stay (HOSPITAL_COMMUNITY): Admission: RE | Admit: 2015-09-27 | Payer: BLUE CROSS/BLUE SHIELD | Source: Ambulatory Visit

## 2015-10-15 ENCOUNTER — Other Ambulatory Visit: Payer: Self-pay | Admitting: Neurology

## 2015-12-12 ENCOUNTER — Encounter: Payer: Self-pay | Admitting: Neurology

## 2015-12-16 NOTE — Telephone Encounter (Signed)
  We should keep the november appointment.

## 2016-01-21 ENCOUNTER — Ambulatory Visit: Payer: BLUE CROSS/BLUE SHIELD | Admitting: Neurology

## 2016-02-05 ENCOUNTER — Other Ambulatory Visit: Payer: Self-pay | Admitting: Neurology

## 2016-02-05 NOTE — Telephone Encounter (Signed)
Pt called to advise she is out of the medication. °

## 2016-02-05 NOTE — Telephone Encounter (Signed)
[facility]

## 2016-02-19 ENCOUNTER — Ambulatory Visit (INDEPENDENT_AMBULATORY_CARE_PROVIDER_SITE_OTHER): Payer: BLUE CROSS/BLUE SHIELD | Admitting: Adult Health

## 2016-02-19 ENCOUNTER — Encounter: Payer: Self-pay | Admitting: Adult Health

## 2016-02-19 VITALS — BP 119/69 | HR 76 | Ht 61.0 in | Wt 198.6 lb

## 2016-02-19 DIAGNOSIS — R569 Unspecified convulsions: Secondary | ICD-10-CM

## 2016-02-19 MED ORDER — LEVETIRACETAM 750 MG PO TABS: 750.0000 mg | ORAL_TABLET | Freq: Two times a day (BID) | ORAL | 11 refills | Status: DC

## 2016-02-19 NOTE — Patient Instructions (Signed)
Continue Keppra.  If your symptoms worsen or you develop new symptoms please let us know.   

## 2016-02-19 NOTE — Progress Notes (Signed)
I agree with the assessment and plan as directed by NP .The patient is known to me . Please send a note to her OB/GYN , too.    Kyran Connaughton, MD

## 2016-02-19 NOTE — Progress Notes (Signed)
PATIENT: Laurie Trujillo DOB: 11/11/1975  REASON FOR VISIT: follow up- seizures HISTORY FROM: patient  HISTORY OF PRESENT ILLNESS: 02/19/16: [name] is a 40 year old female with a history of seizures. She returns today for follow-up. She is currently on Keppra 750 mg twice a day. She reports that she had a seizure in October because she accidentally did not take her medication for 2 days. Since then she's not had any additional seizures. She had her son approximately 5 months ago. She states that she is doing well. She operates a Librarian, academicmotor vehicle without difficulty. Denies any new neurological symptoms. He returns today for an evaluation.  HISTORY 06/25/15: [name] is a 40 year old female with a history of seizures and she is currently pregnant. She returns today for follow-up. She is on Keppra 750 mg twice a day. She reports that she is not had any seizure events while pregnant. She states that she is tolerating Keppra well. She reports that her pregnancy is going as planned with no complications. At home she is able to complete all ADLs independently. She operates a Librarian, academicmotor vehicle without difficulty. She is on folic acid. She denies any new neurological symptoms. She returns today for an evaluation.  HISTORY (DOHMEIER):  12/07/12 (CD): [name] was last seen on 12-02-11 after she had suffered a seizure or presumed to be a seizure on September 13th, 2013. She described it as a feeling familiar to her before she was treated with carbamazepine. She hasn't had a seizure or aura for many years- but the seizures that have happened before were related to late medication intake times or skipped doses. She also noted a trigger of sleep deprivation. She does not drink alcohol and she has had regular work hours and she was productive but allowed to drive after last visit with her. The patient has primary partial complex epilepsy and she has been very well controlled on the carbamazepine brand-name Tegretol.  She's here today to get her refills. I gave her copies of our last visit notes and lab tests.  05/25/13 (LL): Patient here for regular followup, last visit 12/07/12. She has not had any seizures since last visit, doing well. Trying to conceive, but has not been successful yet. No complaints.  Interval history 05-28-14, Mrs. Laurie Trujillo had been seizure-free from October 2014 until February 2016 , when she suffered a seizure during her sleep, of which she was not aware.  Her husband witnessed the event and then another nocturnal seizure on March 20- 16, last week. Both seizures ended without any self injury she did not have a tongue bite, there is no bruising noted. She did not become cyanotic or short any sign of physiologic distress. There was no associated bowel bladder or bowel incontinence. Her husband did not make any attempts to wake her and I'm not sure how long she may have been confused if there was such a possible postictal. She has noticed that both of these nocturnal spells happened after she took her medication in a more irregular time. We discussed today of the should put her on an she's currently taking Tegretol-XR 200 mg 12 hour release form 2 in the morning 3 in the afternoon 12 hours apart. I would like to try to put the patient on a once a day Tegretol form. This will make her compliance easier. Patients with nocturnal seizure only are not refrained from driving.  We dicussed alcohol, sleep deprivation , flashing strobe lights as possible triggers.  Her last MRI  of the brain was at [facility] neurology, under Dr. Jennet Maduro.   Interval history from 11-29-14. Since the patient was last seen in February 2016 she had 4 more seizures.  She is currently still on hormonal therapy with carbamazepine.  She has been compliant with the medication.  Complicating the issue is that she is going through fertility treatments locally with Dr. Fermin Schwab, MD. she is beginnin treatment in  association with progesterone in sesame oil, Medrol tablets, doxycycline 100 mg tablets and of the vivelle dot .patch 0.1 mg. She also takesMenopur injections. 0.25 milligrams for 7 days during that treatment cycle. She will then use beta HCG in injectable form.  Progesterone has been suspected to decrease his she is a fresh old for this reason I would like for her to use Keppra as an associated drug, that is not known to influence fertility and has no effect on hepatic or renal function. I would consider keeping her on her CBZ medication throughout pregnancy , but the drug is category C.  May use Keppra, We will not use phenytoin, topiramate, or Depakote for obvious reasons.   Interval history from 12-31-14,  Mrs. Pizzuto is here for a visit as requested, She has successfully weaned off carbamazepine-Tegretol and has begun injection therapy with a fertility clinic. She is optimistic. She is not experiencing any seizures since her last visit a month ago. She's taking the 24 hour extended release form of Keppra 500 mg 2 a day.  Interval history from the 14th of February 2017, Mrs. Moffet is pregnant with her first child she has now reached [redacted] weeks gestational pregnancy. She had no seizures since the beginning of the pregnancy has no longer taking Tegretol and is currently controlled on Keppra only. The patient was advised of SUDEP and we decided to increase the Keppra to 750 mg bid po. I will start this with her next refill.  SUDEP phenomenon is a higher risk of sudden unexplained death in epileptics, and e affects the least well controlled seizure patients in Disproportion. She is using insulin and no longer oral Glipizide  REVIEW OF SYSTEMS: Out of a complete 14 system review of symptoms, the patient complains only of the following symptoms, and all other reviewed systems are negative.  See history of present illness  ALLERGIES: Allergies  Allergen Reactions  . Metformin And  Related Nausea Only    HOME MEDICATIONS: Outpatient Medications Prior to Visit  Medication Sig Dispense Refill  . insulin aspart (NOVOLOG) 100 UNIT/ML injection Inject 0-5 Units into the skin at bedtime. 10 mL 11  . insulin aspart (NOVOLOG) 100 UNIT/ML injection Inject 0-9 Units into the skin 3 (three) times daily with meals. 10 mL 11  . labetalol (NORMODYNE) 100 MG tablet Take 100 mg by mouth 2 (two) times daily.     Marland Kitchen levETIRAcetam (KEPPRA) 750 MG tablet TAKE 1 TABLET (750 MG TOTAL) BY MOUTH 2 (TWO) TIMES DAILY. 60 tablet 0  . ibuprofen (ADVIL,MOTRIN) 600 MG tablet Take 1 tablet (600 mg total) by mouth every 6 (six) hours. (Patient not taking: Reported on 02/19/2016) 30 tablet 1  . insulin aspart (NOVOLOG) 100 UNIT/ML injection Inject 3 Units into the skin 3 (three) times daily with meals. 10 mL 11  . Prenatal Vit-Fe Fumarate-FA (PRENATAL ONE DAILY) 27-0.8 MG TABS Take 1 tablet by mouth.     No facility-administered medications prior to visit.     PAST MEDICAL HISTORY: Past Medical History:  Diagnosis Date  . AMA (advanced maternal  age) primigravida 35+   . Complex partial seizures (HCC)   . Diabetes mellitus without complication (HCC)   . Headache(784.0)   . Hypertension   . Newborn product of IVF pregnancy   . Obesities, morbid (HCC)   . Other forms of epilepsy and recurrent seizures without mention of intractable epilepsy 12/07/2012  . Seizures (HCC)     PAST SURGICAL HISTORY: Past Surgical History:  Procedure Laterality Date  . CESAREAN SECTION N/A 09/23/2015   Procedure: CESAREAN SECTION;  Surgeon: Marcelle Overlie, MD;  Location: [facility] BIR[facility]ING SU[facility];  Service: Obstetrics;  Laterality: N/A;  . TONSILLECTOMY    . WISDOM TOOTH EXTRACTION      FAMILY HISTORY: Family History  Problem Relation Age of Onset  . Diabetic kidney disease Mother   . Diabetes Mother   . Hyperlipidemia Father   . Diabetic kidney disease Maternal Grandmother   . High blood pressure Maternal  Grandmother   . Hypertension Maternal Grandmother   . Cancer Maternal Grandmother     breast  . High blood pressure Paternal Grandmother   . Diabetic kidney disease Maternal Uncle   . Diabetes Maternal Uncle   . Hypertension Maternal Grandfather     SOCIAL HISTORY: Social History   Social History  . Marital status: Married    Spouse name: N/A  . Number of children: 0  . Years of education: college   Occupational History  .      [employer]   Social History Main Topics  . Smoking status: Never Smoker  . Smokeless tobacco: Never Used  . Alcohol use No  . Drug use: No  . Sexual activity: Yes    Birth control/ protection: None   Other Topics Concern  . Not on file   Social History Narrative   Pt is Rt handed married w/ no children at this time.Pt does take in caffeine at least twice a day.Pt is employed w/ [employer].      PHYSICAL EXAM  Vitals:   02/19/16 0752  BP: 119/69  Pulse: 76  Weight: 198 lb 9.6 oz (90.1 kg)  Height: 5\' 1"  (1.549 m)   Body mass index is 37.53 kg/m.  Generalized: Well developed, in no acute distress   Neurological examination  Mentation: Alert oriented to time, place, history taking. Follows all commands speech and language fluent Cranial nerve II-XII: Pupils were equal round reactive to light. Extraocular movements were full, visual field were full on confrontational test. Facial sensation and strength were normal. Uvula tongue midline. Head turning and shoulder shrug  were normal and symmetric. Motor: The motor testing reveals 5 over 5 strength of all 4 extremities. Good symmetric motor tone is noted throughout.  Sensory: Sensory testing is intact to soft touch on all 4 extremities. No evidence of extinction is noted.  Coordination: Cerebellar testing reveals good finger-nose-finger and heel-to-shin bilaterally.  Gait and station: Gait is normal. Tandem gait is normal. Romberg is negative. No drift is seen.  Reflexes:  Deep tendon reflexes are symmetric and normal bilaterally.   DIAGNOSTIC DATA (LABS, IMAGING, TESTING) - I reviewed patient records, labs, notes, testing and imaging myself where available.  Lab Results  Component Value Date   WBC 15.0 (H) 09/24/2015   HGB 10.5 (L) 09/24/2015   HCT 30.7 (L) 09/24/2015   MCV 83.0 09/24/2015   PLT 269 09/24/2015      ASSESSMENT AND PLAN 39 y.o. year old female  has a past medical history of AMA (advanced maternal age) primigravida  35+; Complex partial seizures (HCC); Diabetes mellitus without complication (HCC); Headache(784.0); Hypertension; Newborn product of IVF pregnancy; Obesities, morbid (HCC); Other forms of epilepsy and recurrent seizures without mention of intractable epilepsy (12/07/2012); and Seizures (HCC). here with:  1. Seizures  Overall the patient is doing well. She has not had any additional seizures. She will continue on Keppra 750 mg twice a day. Advised that if she has any additional seizures she should let us know.. Will follow-up in 6 months or sooner if needed.     Butch PennyMegan Adamariz Gillott, MSN, NP-C 02/19/2016, 8:05 AM [facility] [address], [address] [place], [place] [address] [phone]

## 2016-08-19 ENCOUNTER — Ambulatory Visit: Payer: Self-pay | Admitting: Adult Health

## 2016-08-24 ENCOUNTER — Ambulatory Visit (INDEPENDENT_AMBULATORY_CARE_PROVIDER_SITE_OTHER): Payer: BLUE CROSS/BLUE SHIELD | Admitting: Adult Health

## 2016-08-24 ENCOUNTER — Encounter: Payer: Self-pay | Admitting: Adult Health

## 2016-08-24 VITALS — BP 145/77 | HR 85 | Wt 200.4 lb

## 2016-08-24 DIAGNOSIS — R569 Unspecified convulsions: Secondary | ICD-10-CM | POA: Diagnosis not present

## 2016-08-24 MED ORDER — LEVETIRACETAM 750 MG PO TABS: 750.0000 mg | ORAL_TABLET | Freq: Two times a day (BID) | ORAL | 3 refills | Status: DC

## 2016-08-24 NOTE — Patient Instructions (Signed)
Continue Keppra.  If your symptoms worsen or you develop new symptoms please let us know.   

## 2016-08-24 NOTE — Progress Notes (Signed)
I agree with the assessment and plan as directed by NP .The patient is known to me .   Elleen Coulibaly, MD  

## 2016-08-24 NOTE — Progress Notes (Signed)
PATIENT: Laurie Trujillo DOB: [birth year]  REASON FOR VISIT: follow up- seizures HISTORY FROM: patient  HISTORY OF PRESENT ILLNESS: Today 08/24/16: [name] is a 41 year old female with a history of seizures. She returns today for follow-up. She remains on Keppra 750 mg twice a day. She denies any seizure events. She is able to operate a motor vehicle without difficulty. She currently works full-time. Denies any changes with her mood or behavior. No changes with her gait or balance. She denies any new symptoms. She returns today for an evaluation.  HISTORY 02/19/16: [name] is a 41 year old female with a history of seizures. She returns today for follow-up. She is currently on Keppra 750 mg twice a day. She reports that she had a seizure in October because she accidentally did not take her medication for 2 days. Since then she's not had any additional seizures. She had her son approximately 5 months ago. She states that she is doing well. She operates a Librarian, academic without difficulty. Denies any new neurological symptoms. He returns today for an evaluation.  HISTORY 06/25/15: [name] is a 41 year old female with a history of seizures and she is currently pregnant. She returns today for follow-up. She is on Keppra 750 mg twice a day. She reports that she is not had any seizure events while pregnant. She states that she is tolerating Keppra well. She reports that her pregnancy is going as planned with no complications. At home she is able to complete all ADLs independently. She operates a Librarian, academic without difficulty. She is on folic acid. She denies any new neurological symptoms. She returns today for an evaluation.  HISTORY (DOHMEIER):  12/07/12 (CD): [name] was last seen on 12-02-11 after she had suffered a seizure or presumed to be a seizure on September 13th, 2013. She described it as a feeling familiar to her before she was treated with carbamazepine. She hasn't had a seizure or  aura for many years- but the seizures that have happened before were related to late medication intake times or skipped doses. She also noted a trigger of Laurie Trujillo deprivation. She does not drink alcohol and she has had regular work hours and she was productive but allowed to drive after last visit with her. The patient has primary partial complex epilepsy and she has been very well controlled on the carbamazepine brand-name Tegretol. She's here today to get her refills. I gave her copies of our last visit notes and lab tests.  05/25/13 (LL): Patient here for regular followup, last visit 12/07/12. She has not had any seizures since last visit, doing well. Trying to conceive, but has not been successful yet. No complaints.  Interval history 05-28-14, Mrs. Laurie Trujillo had been seizure-free from October 2014 until February 2016 , when she suffered a seizure during her Laurie Trujillo, of which she was not aware.  Her husband witnessed the event and then another nocturnal seizure on March 20- 16, last week. Both seizures ended without any self injury she did not have a tongue bite, there is no bruising noted. She did not become cyanotic or short any sign of physiologic distress. There was no associated bowel bladder or bowel incontinence. Her husband did not make any attempts to wake her and I'm not sure how long she may have been confused if there was such a possible postictal. She has noticed that both of these nocturnal spells happened after she took her medication in a more irregular time. We discussed today of the should  put her on an she's currently taking Tegretol-XR 200 mg 12 hour release form 2 in the morning 3 in the afternoon 12 hours apart. I would like to try to put the patient on a once a day Tegretol form. This will make her compliance easier. Patients with nocturnal seizure only are not refrained from driving.  We dicussed alcohol, Laurie Trujillo deprivation , flashing strobe lights as possible triggers.  Her last  MRI of the brain was at [facility] neurology, under Dr. Jennet Maduro.   Interval history from 11-29-14. Since the patient was last seen in February 2016 she had 4 more seizures.  She is currently still on hormonal therapy with carbamazepine.  She has been compliant with the medication.  Complicating the issue is that she is going through fertility treatments locally with Dr. Fermin Schwab, MD. she is beginnin treatment in association with progesterone in sesame oil, Medrol tablets, doxycycline 100 mg tablets and of the vivelle dot .patch 0.1 mg. She also takesMenopur injections. 0.25 milligrams for 7 days during that treatment cycle. She will then use beta HCG in injectable form.  Progesterone has been suspected to decrease his she is a fresh old for this reason I would like for her to use Keppra as an associated drug, that is not known to influence fertility and has no effect on hepatic or renal function. I would consider keeping her on her CBZ medication throughout pregnancy , but the drug is category C.  May use Keppra, We will not use phenytoin, topiramate, or Depakote for obvious reasons.   Interval history from 12-31-14,  Mrs. Laurie Trujillo is here for a visit as requested, She has successfully weaned off carbamazepine-Tegretol and has begun injection therapy with a fertility clinic. She is optimistic. She is not experiencing any seizures since her last visit a month ago. She's taking the 24 hour extended release form of Keppra 500 mg 2 a day.  Interval history from the 14th of February 2017, Mrs. Laurie Trujillo is pregnant with her first child she has now reached [redacted] weeks gestational pregnancy. She had no seizures since the beginning of the pregnancy has no longer taking Tegretol and is currently controlled on Keppra only. The patient was advised of SUDEP and we decided to increase the Keppra to 750 mg bid po. I will start this with her next refill.  SUDEP phenomenon is a higher risk of  sudden unexplained death in epileptics, and e affects the least well controlled seizure patients in Disproportion. She is using insulin and no longer oral Glipizide   REVIEW OF SYSTEMS: Out of a complete 14 system review of symptoms, the patient complains only of the following symptoms, and all other reviewed systems are negative.  See history of present illness  ALLERGIES: Allergies  Allergen Reactions  . Metformin And Related Nausea Only    HOME MEDICATIONS: Outpatient Medications Prior to Visit  Medication Sig Dispense Refill  . atorvastatin (LIPITOR) 20 MG tablet Take 20 mg by mouth daily.  1  . insulin lispro (HUMALOG) 100 UNIT/ML injection Inject 12 Units into the skin 3 (three) times daily with meals.    . insulin lispro (HUMALOG) 100 UNIT/ML injection Inject into the skin. 25u in PM and 24units PM HUMALIN N    . labetalol (NORMODYNE) 100 MG tablet Take 100 mg by mouth 2 (two) times daily.     Marland Kitchen labetalol (NORMODYNE) 200 MG tablet Take 200 mg by mouth 2 (two) times daily.  2  . levETIRAcetam (KEPPRA) 750 MG tablet  Take 1 tablet (750 mg total) by mouth 2 (two) times daily. 60 tablet 11  . lisinopril (PRINIVIL,ZESTRIL) 10 MG tablet TAKE ONE TABLET (10 MG TOTAL) BY MOUTH DAILY.  1   No facility-administered medications prior to visit.     PAST MEDICAL HISTORY: Past Medical History:  Diagnosis Date  . AMA (advanced maternal age) primigravida 35+   . Complex partial seizures (HCC)   . Diabetes mellitus without complication (HCC)   . Headache(784.0)   . Hypertension   . Newborn product of IVF pregnancy   . Obesities, morbid (HCC)   . Other forms of epilepsy and recurrent seizures without mention of intractable epilepsy 12/07/2012  . Seizures (HCC)     PAST SURGICAL HISTORY: Past Surgical History:  Procedure Laterality Date  . CESAREAN SECTION N/A 09/23/2015   Procedure: CESAREAN SECTION;  Surgeon: Marcelle OverlieMichelle Grewal, MD;  [facility];  Service: Obstetrics;   Laterality: N/A;  . TONSILLECTOMY    . WISDOM TOOTH EXTRACTION      FAMILY HISTORY: Family History  Problem Relation Age of Onset  . Diabetic kidney disease Mother   . Diabetes Mother   . Hyperlipidemia Father   . Diabetic kidney disease Maternal Grandmother   . High blood pressure Maternal Grandmother   . Hypertension Maternal Grandmother   . Cancer Maternal Grandmother        breast  . High blood pressure Paternal Grandmother   . Diabetic kidney disease Maternal Uncle   . Diabetes Maternal Uncle   . Hypertension Maternal Grandfather     SOCIAL HISTORY: Social History   Social History  . Marital status: Married    Spouse name: N/A  . Number of children: 0  . Years of education: college   Occupational History  .      [employer]   Social History Main Topics  . Smoking status: Never Smoker  . Smokeless tobacco: Never Used  . Alcohol use No  . Drug use: No  . Sexual activity: Yes    Birth control/ protection: None   Other Topics Concern  . Not on file   Social History Narrative   Pt is Rt handed married w one child, ALEX at this time.Pt does take in caffeine at least twice a day.Pt is employed w/ [employer].      PHYSICAL EXAM  Vitals:   08/24/16 0730  BP: (!) 145/77  Pulse: 85  Weight: 200 lb 6.4 oz (90.9 kg)   Body mass index is 37.87 kg/m.  Generalized: Well developed, in no acute distress   Neurological examination  Mentation: Alert oriented to time, place, history taking. Follows all commands speech and language fluent Cranial nerve II-XII: Pupils were equal round reactive to light. Extraocular movements were full, visual field were full on confrontational test. Facial sensation and strength were normal. Uvula tongue midline. Head turning and shoulder shrug  were normal and symmetric. Motor: The motor testing reveals 5 over 5 strength of all 4 extremities. Good symmetric motor tone is noted throughout.  Sensory: Sensory testing is  intact to soft touch on all 4 extremities. No evidence of extinction is noted.  Coordination: Cerebellar testing reveals good finger-nose-finger and heel-to-shin bilaterally.  Gait and station: Gait is normal. Tandem gait is normal. Romberg is negative. No drift is seen.  Reflexes: Deep tendon reflexes are symmetric and normal bilaterally.   DIAGNOSTIC DATA (LABS, IMAGING, TESTING) - I reviewed patient records, labs, notes, testing and imaging myself where available.  Lab  Results  Component Value Date   WBC 15.0 (H) 09/24/2015   HGB 10.5 (L) 09/24/2015   HCT 30.7 (L) 09/24/2015   MCV 83.0 09/24/2015   PLT 269 09/24/2015      ASSESSMENT AND PLAN 41 y.o. year old female  has a past medical history of AMA (advanced maternal age) primigravida 35+; Complex partial seizures (HCC); Diabetes mellitus without complication (HCC); Headache(784.0); Hypertension; Newborn product of IVF pregnancy; Obesities, morbid (HCC); Other forms of epilepsy and recurrent seizures without mention of intractable epilepsy (12/07/2012); and Seizures (HCC). here with:  1. Seizures  Overall the patient is doing well. She will continue on Keppra 750 mg twice a day. A refill was sent in today. She is advised that if she has any seizure events she should let us know. She will follow-up in 6 months or sooner if needed.  I spent 15 minutes with the patient. 50% of this time was spent discussing her medication Keppra.      Butch Penny, MSN, NP-C 08/24/2016, 6:12 AM [facility] [address] [place], [place] [address] [phone]

## 2016-11-30 ENCOUNTER — Other Ambulatory Visit: Payer: Self-pay | Admitting: Obstetrics and Gynecology

## 2016-11-30 DIAGNOSIS — R928 Other abnormal and inconclusive findings on diagnostic imaging of breast: Secondary | ICD-10-CM

## 2016-12-02 ENCOUNTER — Other Ambulatory Visit: Payer: Self-pay | Admitting: Obstetrics and Gynecology

## 2016-12-02 ENCOUNTER — Ambulatory Visit
Admission: RE | Admit: 2016-12-02 | Discharge: 2016-12-02 | Disposition: A | Discharge status: Home / Self Care | Payer: BLUE CROSS/BLUE SHIELD | Source: Ambulatory Visit | Attending: Obstetrics and Gynecology | Admitting: Obstetrics and Gynecology

## 2016-12-02 DIAGNOSIS — N6320 Unspecified lump in the left breast, unspecified quadrant: Secondary | ICD-10-CM

## 2016-12-02 DIAGNOSIS — R928 Other abnormal and inconclusive findings on diagnostic imaging of breast: Secondary | ICD-10-CM

## 2016-12-02 DIAGNOSIS — R599 Enlarged lymph nodes, unspecified: Secondary | ICD-10-CM

## 2016-12-08 ENCOUNTER — Other Ambulatory Visit: Payer: Self-pay | Admitting: Obstetrics and Gynecology

## 2016-12-08 DIAGNOSIS — N6320 Unspecified lump in the left breast, unspecified quadrant: Secondary | ICD-10-CM

## 2016-12-09 ENCOUNTER — Ambulatory Visit
Admission: RE | Admit: 2016-12-09 | Discharge: 2016-12-09 | Disposition: A | Discharge status: Home / Self Care | Payer: BLUE CROSS/BLUE SHIELD | Source: Ambulatory Visit | Attending: Obstetrics and Gynecology | Admitting: Obstetrics and Gynecology

## 2016-12-09 DIAGNOSIS — R599 Enlarged lymph nodes, unspecified: Secondary | ICD-10-CM

## 2016-12-09 DIAGNOSIS — N6320 Unspecified lump in the left breast, unspecified quadrant: Secondary | ICD-10-CM

## 2017-02-15 ENCOUNTER — Ambulatory Visit: Payer: Self-pay | Admitting: Adult Health

## 2017-06-03 ENCOUNTER — Ambulatory Visit (INDEPENDENT_AMBULATORY_CARE_PROVIDER_SITE_OTHER): Payer: Managed Care, Other (non HMO) | Admitting: Adult Health

## 2017-06-03 ENCOUNTER — Encounter: Payer: Self-pay | Admitting: Adult Health

## 2017-06-03 VITALS — BP 131/75 | HR 94 | Ht 61.0 in | Wt 210.0 lb

## 2017-06-03 DIAGNOSIS — R569 Unspecified convulsions: Secondary | ICD-10-CM | POA: Diagnosis not present

## 2017-06-03 MED ORDER — LEVETIRACETAM 750 MG PO TABS: 750.0000 mg | ORAL_TABLET | Freq: Two times a day (BID) | ORAL | 3 refills | Status: DC

## 2017-06-03 NOTE — Patient Instructions (Signed)
Your Plan:  Continue Keppra  If your symptoms worsen or you develop new symptoms please let us know.    Thank you for coming to see us at [facility]. I hope we have been able to provide you high quality care today.  You may receive a patient satisfaction survey over the next few weeks. We would appreciate your feedback and comments so that we may continue to improve ourselves and the health of our patients.  

## 2017-06-03 NOTE — Progress Notes (Signed)
I have read the note, and I agree with the clinical assessment and plan.  Jaquasha Carnevale K Syra Sirmons   

## 2017-06-03 NOTE — Progress Notes (Signed)
PATIENT: Laurie Trujillo DOB: Aug 20, 1975  REASON FOR VISIT: follow up- seizures HISTORY FROM: patient  HISTORY OF PRESENT ILLNESS: Today 06/03/17 Laurie Trujillo is a 42 year old female with a history of seizures.  She returns today for follow-up.  She denies any seizure events.  She continues on Keppra 750 mg twice a day.  She is able to complete all ADLs independently.  Denies any change in mood or behavior.  No changes in her gait or balance.  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  She returns today for an evaluation.    HISTORY 08/24/16: Laurie Trujillo is a 42 year old female with a history of seizures. She returns today for follow-up. She remains on Keppra 750 mg twice a day. She denies any seizure events. She is able to operate a motor vehicle without difficulty. She currently works full-time. Denies any changes with her mood or behavior. No changes with her gait or balance. She denies any new symptoms. She returns today for an evaluation.  HISTORY 02/19/16: Laurie Trujillo a 42 year old female with a history of seizures. She returns today for follow-up. She is currently on Keppra 750 mg twice a day. She reports that she had a seizure in October because she accidentally did not take her medication for 2 days. Since then she's not had any additional seizures. She had her son approximately 5 months ago. She states that she is doing well. She operates a Librarian, academic without difficulty. Denies any new neurological symptoms. He returns today for an evaluation.    REVIEW OF SYSTEMS: Out of a complete 14 system review of symptoms, the patient complains only of the following symptoms, and all other reviewed systems are negative.  See HPI  ALLERGIES: Allergies  Allergen Reactions  . Metformin And Related Nausea Only    HOME MEDICATIONS: Outpatient Medications Prior to Visit  Medication Sig Dispense Refill  . atorvastatin (LIPITOR) 20 MG tablet Take 20 mg by mouth  daily.  1  . insulin lispro (HUMALOG) 100 UNIT/ML injection Inject 12 Units into the skin 3 (three) times daily with meals.    . insulin NPH Human (HUMULIN N,NOVOLIN N) 100 UNIT/ML injection Inject into the skin.     Marland Kitchen labetalol (NORMODYNE) 200 MG tablet Take 200 mg by mouth 2 (two) times daily.  2  . levETIRAcetam (KEPPRA) 750 MG tablet Take 1 tablet (750 mg total) by mouth 2 (two) times daily. 180 tablet 3  . lisinopril (PRINIVIL,ZESTRIL) 10 MG tablet TAKE ONE TABLET (10 MG TOTAL) BY MOUTH DAILY.  1  . Multiple Vitamins-Minerals (MULTIVITAMIN ADULT PO) Take by mouth.     No facility-administered medications prior to visit.     PAST MEDICAL HISTORY: Past Medical History:  Diagnosis Date  . AMA (advanced maternal age) primigravida 35+   . Complex partial seizures (HCC)   . Diabetes mellitus without complication (HCC)   . Headache(784.0)   . Hypertension   . Newborn product of IVF pregnancy   . Obesities, morbid (HCC)   . Other forms of epilepsy and recurrent seizures without mention of intractable epilepsy 12/07/2012  . Seizures (HCC)     PAST SURGICAL HISTORY: Past Surgical History:  Procedure Laterality Date  . CESAREAN SECTION N/A 09/23/2015   Procedure: CESAREAN SECTION;  Surgeon: Marcelle Overlie, MD;  Location: [facility]TH[facility] SUIT[facility];  Service: Obstetrics;  Laterality: N/A;  . TONSILLECTOMY    . WISDOM TOOTH EXTRACTION      FAMILY HISTORY: Family History  Problem Relation Age of Onset  . Diabetic kidney disease Mother   . Diabetes Mother   . Hyperlipidemia Father   . Diabetic kidney disease Maternal Grandmother   . High blood pressure Maternal Grandmother   . Hypertension Maternal Grandmother   . Cancer Maternal Grandmother        breast  . Breast cancer Maternal Grandmother   . High blood pressure Paternal Grandmother   . Diabetic kidney disease Maternal Uncle   . Diabetes Maternal Uncle   . Hypertension Maternal Grandfather     SOCIAL HISTORY: Social History    Socioeconomic History  . Marital status: Married    Spouse name: Not on file  . Number of children: 0  . Years of education: college  . Highest education level: Not on file  Occupational History    Comment: [employer]  Social Needs  . Financial resource strain: Not on file  . Food insecurity:    Worry: Not on file    Inability: Not on file  . Transportation needs:    Medical: Not on file    Non-medical: Not on file  Tobacco Use  . Smoking status: Never Smoker  . Smokeless tobacco: Never Used  Substance and Sexual Activity  . Alcohol use: No  . Drug use: No  . Sexual activity: Yes    Birth control/protection: None  Lifestyle  . Physical activity:    Days per week: Not on file    Minutes per session: Not on file  . Stress: Not on file  Relationships  . Social connections:    Talks on phone: Not on file    Gets together: Not on file    Attends religious service: Not on file    Active member of club or organization: Not on file    Attends meetings of clubs or organizations: Not on file    Relationship status: Not on file  . Intimate partner violence:    Fear of current or ex partner: Not on file    Emotionally abused: Not on file    Physically abused: Not on file    Forced sexual activity: Not on file  Other Topics Concern  . Not on file  Social History Narrative   Pt is Rt handed married w one child, ALEX at this time.Pt does take in caffeine at least twice a day.Pt is employed w/ [employer].      PHYSICAL EXAM  Vitals:   06/03/17 0736  BP: 131/75  Pulse: 94  Weight: 210 lb (95.3 kg)  Height: 5\' 1"  (1.549 m)   Body mass index is 39.68 kg/m.  Generalized: Well developed, in no acute distress   Neurological examination  Mentation: Alert oriented to time, place, history taking. Follows all commands speech and language fluent Cranial nerve II-XII: Pupils were equal round reactive to light. Extraocular movements were full, visual field were  full on confrontational test. Facial sensation and strength were normal. Uvula tongue midline. Head turning and shoulder shrug  were normal and symmetric. Motor: The motor testing reveals 5 over 5 strength of all 4 extremities. Good symmetric motor tone is noted throughout.  Sensory: Sensory testing is intact to soft touch on all 4 extremities. No evidence of extinction is noted.  Coordination: Cerebellar testing reveals good finger-nose-finger and heel-to-shin bilaterally.  Gait and station: Gait is normal.  Reflexes: Deep tendon reflexes are symmetric and normal bilaterally.   DIAGNOSTIC DATA (LABS, IMAGING, TESTING) - I reviewed patient records, labs, notes, testing  and imaging myself where available.  Lab Results  Component Value Date   WBC 15.0 (H) 09/24/2015   HGB 10.5 (L) 09/24/2015   HCT 30.7 (L) 09/24/2015   MCV 83.0 09/24/2015   PLT 269 09/24/2015      ASSESSMENT AND PLAN 42 y.o. 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 here with:  1. Seizures  Overall the patient is doing well.  Will continue on Keppra 750 mg twice a day.  Advised that if her symptoms worsen or she develops any symptoms she should let us know.  She will follow-up in 1 year or sooner as needed.   Butch PennyMegan Jermell Holeman, MSN, NP-C 06/03/2017, 7:36 AM [facility] [address], [address] [place], [place] [address] [phone]   I spent 15 minutes with the patient. 50% of this time was spent discussing plan of care

## 2018-02-09 IMAGING — US US MFM OB FOLLOW-UP
1 series · 14 of 28 positions shown · non-contrast
Comparison: none

[id number]
[place] [HOSPITAL]

1  [address]ULICANU BELAY            [id number]      [id number]     [id number]
Indications
22 weeks gestation of pregnancy
Diabetes - Pregestational, 2nd trimester
(insulin)
Seizure disorder (levetiractram)
Hypertension - Chronic/Pre-existing
Advanced maternal age primigravida 38,
second trimester
Pregnancy resulting from assisted
reproductive technology (IVF)
OB History
Gravidity:    1         Term:   0        Prem:   0        SAB:   0
TOP:          0       Ectopic:  0        Living: 0
Fetal Evaluation
Num Of Fetuses:     1
Fetal Heart         145
Rate(bpm):
Cardiac Activity:   Observed
Presentation:       Cephalic
Placenta:           Posterior, above cervical os
P. Cord Insertion:  Previously Visualized
Amniotic Fluid
AFI FV:      Subjectively within normal limits
Larg Pckt:     7.0  cm
Biometry
BPD:        58  mm     G. Age:  23w 6d                  CI:        82.15   %   70 - 86
FL/HC:      17.8   %   18.4 -
HC:      201.9  mm     G. Age:  22w 2d         52  %    HC/AC:      1.17       1.06 -
AC:      172.6  mm     G. Age:  22w 1d         49  %    FL/BPD:     61.9   %   71 - 87
FL:       35.9  mm     G. Age:  21w 3d         21  %    FL/AC:      20.8   %   20 - 24
Est. FW:     465  gm          1 lb      45  %
Gestational Age
U/S Today:     22w 3d                                        EDD:   10/05/15
Best:          22w 0d    Det. By:   Early Ultrasound         EDD:   10/08/15
(02/18/15)
Anatomy
Cranium:          Appears normal         LVOT:             Appears normal
Fetal Cavum:      Appears normal         Aortic Arch:      Previously seen
Ventricles:       Appears normal         Ductal Arch:      Previously seen
Choroid Plexus:   Previously seen        Diaphragm:        Appears normal
Cerebellum:       Appears normal         Stomach:          Appears normal, left
sided
Posterior Fossa:  Appears normal         Abdomen:          Appears normal
Nuchal Fold:      Previously seen        Abdominal Wall:   Previously seen
Face:             Orbits and profile     Cord Vessels:     Previously seen
previously seen
Lips:             Previously seen        Kidneys:          Appear normal
Palate:           Previously seen        Bladder:          Appears normal
Fetal Thoracic:   Previously seen        Spine:            Previously seen
Heart:            Echogenic focus        Upper             Previously seen
in LV
Extremities:
RVOT:             Appears normal         Lower             Previously seen
Other:  Male gender. Heels and 5th digit previously visualized. Technically
difficult due to fetal position.
Cervix Uterus Adnexa
Cervix
Length:            3.6  cm.
Normal appearance by transabdominal scan.
Adnexa:       No abnormality visualized.
Impression
INDICATION: 39 yr old G1P0 at 33w0d with type II diabetes,
chronic hypertension, and seizure disorder for fetal growth

[Series 1: us mfm ob follow-up · 14 of 47 slices shown]
[im 2/47]
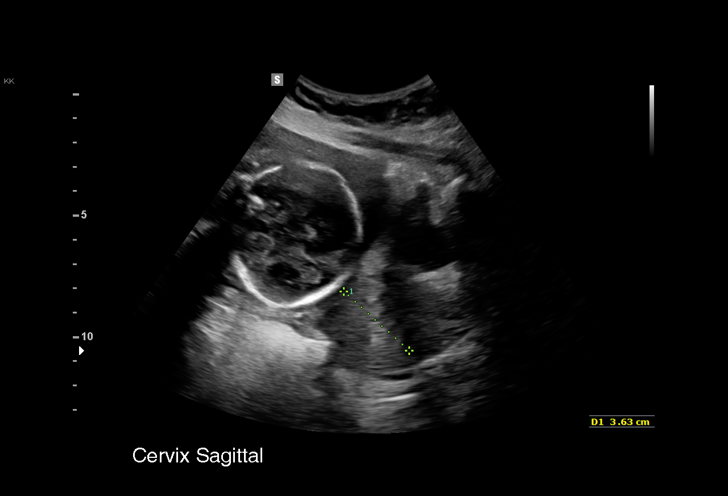
[im 6/47]
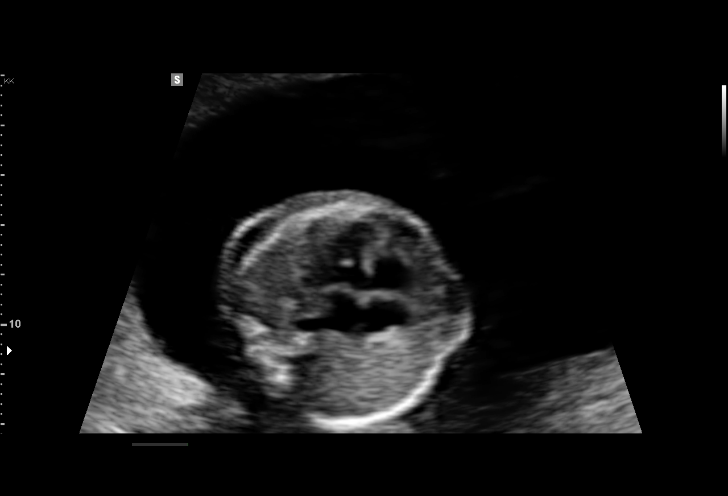
[im 9/47]
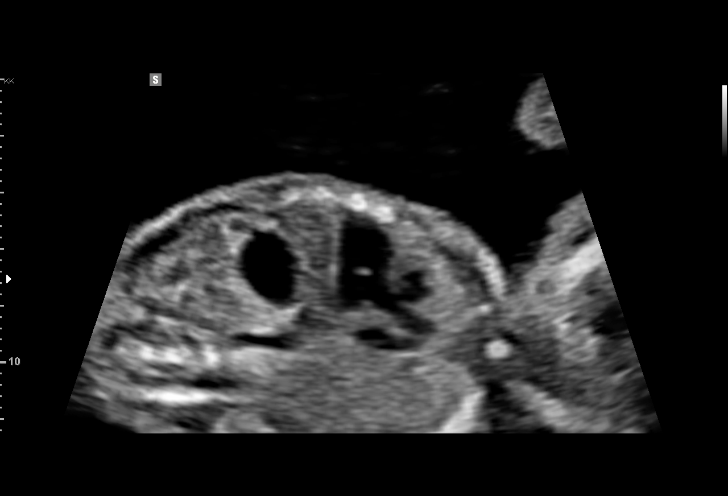
[im 12/47]
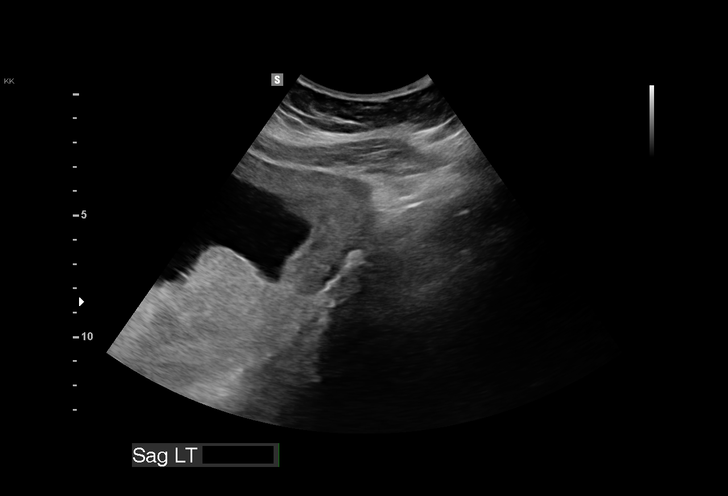
[im 16/47]
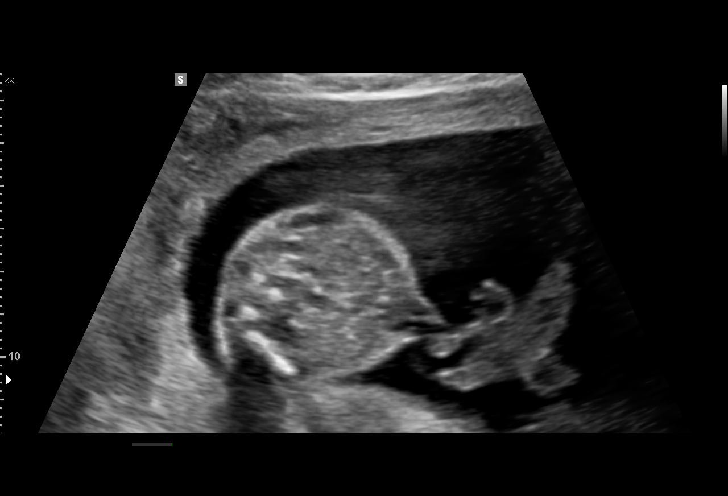
[im 19/47]
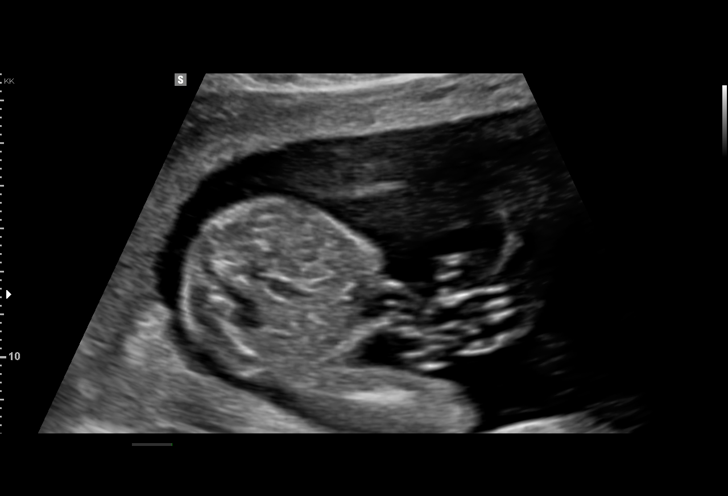
[im 23/47]
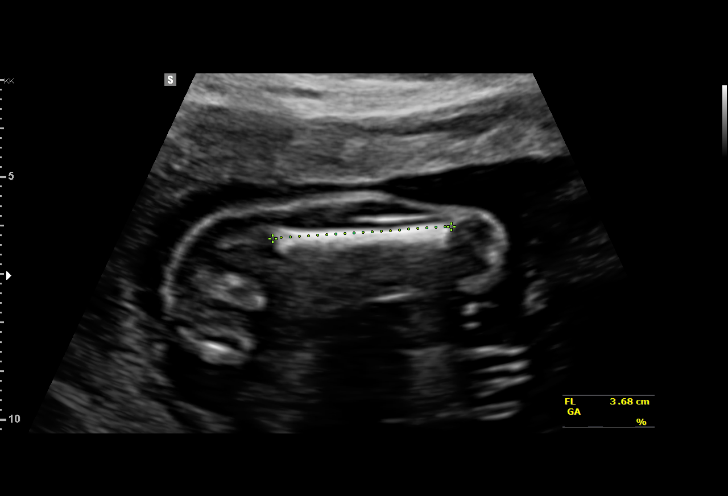
[im 26/47]
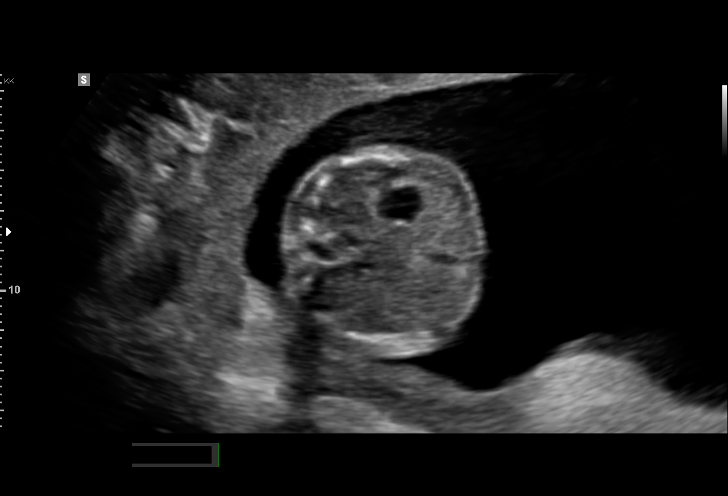
[im 29/47]
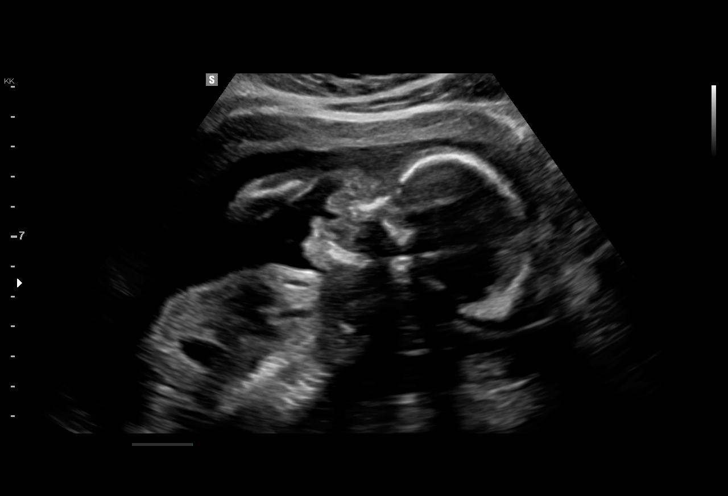
[im 33/47]
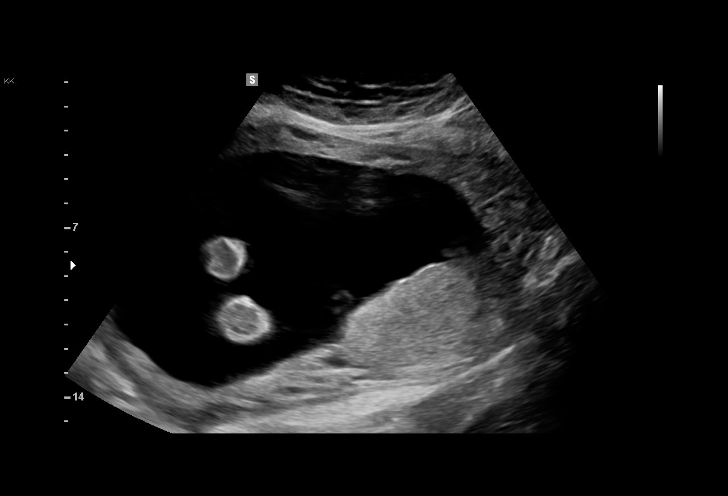
[im 36/47]
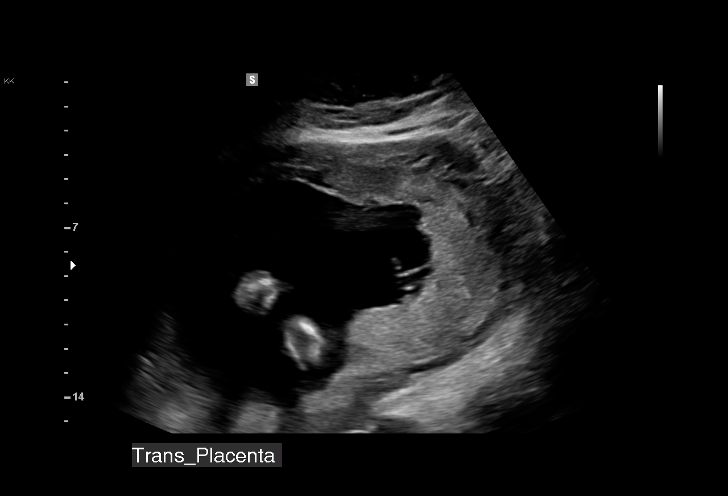
[im 40/47]
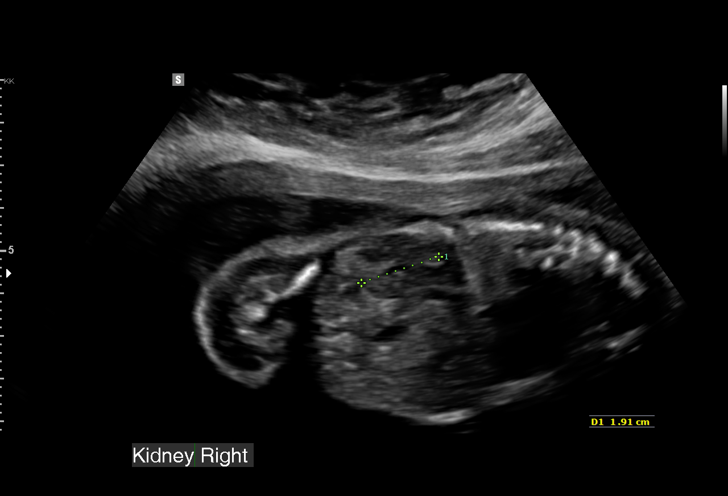
[im 43/47]
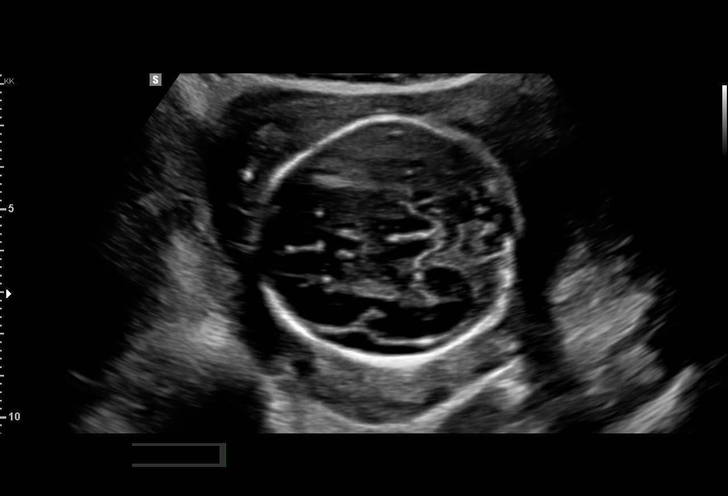
[im 47/47]
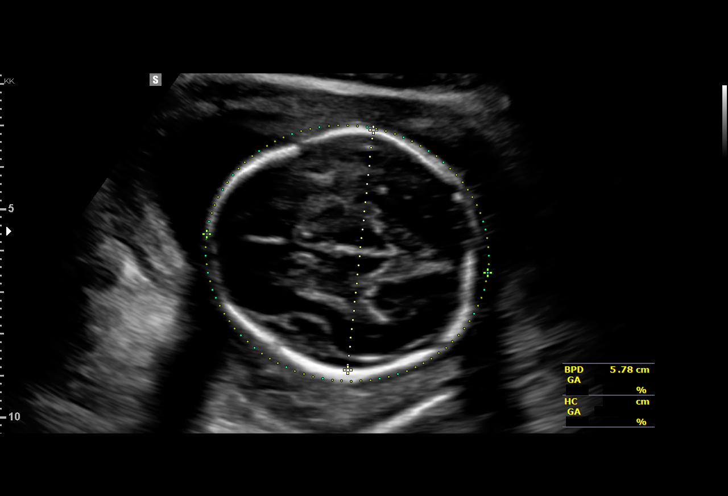

[14 of 28 positions shown; findings below may reference images not displayed]

FINDINGS: 1. Active single intrauterine pregnancy.
2. Fetal biometry is consistent with EFW 45th%
3. Posterior placenta without evidence of previa.
4. Normal amniotic fluid volume.
5. Normal transabdominal cervical length.
6. There remains an echogenic focus in the left ventricle,
noting patient understands this is not a structural defect and
requires no postnatal follow or intervention
7. The remainder of the interval fetal anatomic survey is
normal.
Recommendations

Given DM/HTN,
-interval growth monthly (scheduled through 38 weeks)
-begin antenatal testing at 32 weeks
-delivery 38-39 weeks

## 2018-06-30 ENCOUNTER — Telehealth: Payer: Self-pay | Admitting: *Deleted

## 2018-06-30 MED ORDER — LEVETIRACETAM 750 MG PO TABS: 750.0000 mg | ORAL_TABLET | Freq: Two times a day (BID) | ORAL | 0 refills | Status: DC

## 2018-06-30 NOTE — Telephone Encounter (Signed)
I received request for generic keppra for pt.  Last seen 05/2017.  Needs f/u.  I called and LMVM for her to return call and make VV appt with Amy NP ([email or web address]) Refilled for 30 days.

## 2018-07-04 NOTE — Telephone Encounter (Signed)
Pt requesting a call stating she is able to have a vv but will need it scheduled for this coming Monday 5/11 due to being off of work. Please call to advise and schedule (5/11currently full)

## 2018-07-04 NOTE — Telephone Encounter (Signed)
LMVM for pt ok to make 07-11-18 appt with amy lomax, np for seizures.  Please call back to schedule.

## 2018-07-06 NOTE — Telephone Encounter (Signed)
Patient called back to say that she was available to do a virtual visit with Megan on 07/11/2018 due to it being her day off. Not sure if she can do the 07/18/2018. Please advise

## 2018-07-06 NOTE — Telephone Encounter (Signed)
LMVM for pt to return call and make appt with MM/NP.  No availability 07-11-18 but has some on 07-18-18.

## 2018-07-07 NOTE — Telephone Encounter (Signed)
I spoke to pt this am.  She consented to DOXY.ME video visit on 07-28-18 at 7:30AM.  Due to current COVID 19 pandemic, our office is severely reducing in office visits until further notice, in order to minimize the risk to our patients and healthcare providers.  Pt understands that although there may be some limitations with this type of visit, we will take all precautions to reduce any security or privacy concerns.  Pt understands that this will be treated like an in office visit and we will file with pt's insurance.  She has enough keppra to get to appointment, last fill 06-30-18 # 60. Email: [email or web address] sent.

## 2018-07-19 ENCOUNTER — Telehealth: Payer: Self-pay

## 2018-07-19 NOTE — Telephone Encounter (Signed)
Unable to get in contact with the patient to convert their office visit with [facility] on 07/27/2018 into a doxy.me visit. I left a voicemail asking the patient to return my call. Office number was provided.   If patient calls back please r/s their appt time for later on the same day and  convert their office visit into a doxy.me visit.

## 2018-07-26 ENCOUNTER — Telehealth: Payer: Self-pay | Admitting: *Deleted

## 2018-07-26 NOTE — Telephone Encounter (Signed)
LMVM for pt to return call and confirm appt with AL/NP for 07-28-18 at 0730 (change from MM/NP schedule (same date and time). Also if received email for doxy.me visit.

## 2018-07-28 ENCOUNTER — Ambulatory Visit: Payer: Self-pay | Admitting: Adult Health

## 2018-07-28 ENCOUNTER — Other Ambulatory Visit: Payer: Self-pay

## 2018-07-28 ENCOUNTER — Ambulatory Visit (INDEPENDENT_AMBULATORY_CARE_PROVIDER_SITE_OTHER): Payer: BLUE CROSS/BLUE SHIELD | Admitting: Family Medicine

## 2018-07-28 ENCOUNTER — Encounter: Payer: Self-pay | Admitting: Family Medicine

## 2018-07-28 DIAGNOSIS — G40209 Localization-related (focal) (partial) symptomatic epilepsy and epileptic syndromes with complex partial seizures, not intractable, without status epilepticus: Secondary | ICD-10-CM | POA: Diagnosis not present

## 2018-07-28 DIAGNOSIS — R569 Unspecified convulsions: Secondary | ICD-10-CM | POA: Diagnosis not present

## 2018-07-28 MED ORDER — LEVETIRACETAM 750 MG PO TABS: 750.0000 mg | ORAL_TABLET | Freq: Two times a day (BID) | ORAL | 3 refills | Status: DC

## 2018-07-28 NOTE — Progress Notes (Signed)
PATIENT: Laurie Trujillo DOB: 10/11/1975  REASON FOR VISIT: follow up HISTORY FROM: patient  Virtual Visit via Telephone Note  I connected with Laurie Trujillo on 07/28/18 at  7:30 AM EDT by telephone and verified that I am speaking with the correct person using two identifiers.   I discussed the limitations, risks, security and privacy concerns of performing an evaluation and management service by telephone and the availability of in person appointments. I also discussed with the patient that there may be a patient responsible charge related to this service. The patient expressed understanding and agreed to proceed.   History of Present Illness:  07/28/18 Laurie Lyeryn Skluzacek is a 43 y.o. female for follow up of seizure disorder. She is taking levetiracetam 750mg  BID and tolerating well. No missed doses. She denies seizure activity. Last seizure was in 12/2015. She is driving. She is followed closely by PCP and endocrinology for DMT2 and HTN. She recently started Ozempic. She reports CBG's are 150-250. Last A1C was 8.6.    [facility]  Laurie Trujillo is a 43 year old female with a history of seizures. She returns today for follow-up.  She denies any seizure events. She continues on Keppra 750 mg twice a day.  She is able to complete all ADLs independently.  Denies any change in mood or behavior.  No changes in her gait or balance.  She operates a Librarian, academicmotor vehicle.  She denies any new neurological symptoms.  She returns today for an evaluation.    HISTORY 08/24/16:Laurie Trujillo is a 43 year old female with a history of seizures. She returns today for follow-up. She remains on Keppra 750 mg twice a day. She denies any seizure events. She is able to operate a motor vehicle without difficulty. She currently works full-time. Denies any changes with her mood or behavior. No changes with her gait or balance. She denies any new symptoms. She returns today for an evaluation.   HISTORY12/20/17: Ms. Laurie FewRansomis a 43 year old female with a history of seizures. She returns today for follow-up. She is currently on Keppra 750 mg twice a day. She reports that she had a seizure in October because she accidentally did not take her medication for 2 days. Since then she's not had any additional seizures. She had her son approximately 5 months ago. She states that she is doing well. She operates a Librarian, academicmotor vehicle without difficulty. Denies any new neurological symptoms. He returns today for an evaluation.  Observations/Objective:  Generalized: Well developed, in no acute distress  Mentation: Alert oriented to time, place, history taking. Follows all commands speech and language fluent   Assessment and Plan:  43 y.o. 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 with    ICD-10-CM   1. Partial symptomatic epilepsy with complex partial seizures, not intractable, without status epilepticus (HCC) G40.209   2. Seizures (HCC) R56.9    She continues to do well with levetiracetam 750mg  twice daily. She is tolerating medication with no obvious adverse effects. She denies seizure activity. We will continue current treatment regimen. Seizure precautions given. She was educated on risks of levetriacetam in pregnancy. I have advised regular follow up with PCP and endocrinology for management of DM. Refill sent to pharmacy and annual  follow up advised. She verbalizes understanding and agreement with this plan.   No orders of the defined types were placed in this encounter.   Meds ordered this encounter  Medications  . levETIRAcetam (KEPPRA) 750 MG tablet    Sig:  Take 1 tablet (750 mg total) by mouth 2 (two) times daily.    Dispense:  180 tablet    Refill:  3    Please cancel prescription that I escribed to [facility].  Thanks    Order Specific Question:   Supervising Provider    Answer:   Anson Fret [[id number]]     Follow Up Instructions:  I discussed the assessment and treatment plan with the patient. The patient was provided an opportunity to ask questions and all were answered. The patient agreed with the plan and demonstrated an understanding of the instructions.   The patient was advised to call back or seek an in-person evaluation if the symptoms worsen or if the condition fails to improve as anticipated.  I provided 20 minutes of non-face-to-face time during this encounter. Patient is located at her place of residence during video conference. Provider is in the office. Alverda Skeans, RN helped to facilitate visit.    Shawnie Dapper, NP

## 2019-10-09 ENCOUNTER — Telehealth: Payer: Self-pay | Admitting: Family Medicine

## 2019-10-09 MED ORDER — LEVETIRACETAM 750 MG PO TABS: 750.0000 mg | ORAL_TABLET | Freq: Two times a day (BID) | ORAL | 3 refills | Status: DC

## 2019-10-09 NOTE — Telephone Encounter (Signed)
Pt request refill levETIRAcetam (KEPPRA) 750 MG tablet at [facility] [address] [place], [place]. Pt request to change pharmacy. Pt would like a call from the nurse.

## 2019-10-09 NOTE — Telephone Encounter (Signed)
Spoke to pt and made appt and refilled her sz medications.  She has been doing well.

## 2019-10-11 ENCOUNTER — Telehealth (INDEPENDENT_AMBULATORY_CARE_PROVIDER_SITE_OTHER): Payer: BC Managed Care – PPO | Admitting: Adult Health

## 2019-10-11 DIAGNOSIS — G40209 Localization-related (focal) (partial) symptomatic epilepsy and epileptic syndromes with complex partial seizures, not intractable, without status epilepticus: Secondary | ICD-10-CM | POA: Diagnosis not present

## 2019-10-11 NOTE — Progress Notes (Addendum)
PATIENT: Martinique Pizzimenti DOB: 04/19/1975  REASON FOR VISIT: follow up HISTORY FROM: patient  Virtual Visit via Video Note  I connected with [name] on 10/11/19 at  8:00 AM EDT by a video enabled telemedicine application located remotely at [facility] and verified that I am speaking with the correct person using two identifiers who was located in their car   I discussed the limitations of evaluation and management by telemedicine and the availability of in person appointments. The patient expressed understanding and agreed to proceed.   PATIENT: Yoshiye Kraft DOB: 1975-08-13  REASON FOR VISIT: follow up HISTORY FROM: patient  HISTORY OF PRESENT ILLNESS: Today 10/11/19:  Ms. Mandt is a 44 year old female with a history of seizures.  She returns today for follow-up.  She reports that she is not had any seizure events.  She remains on Keppra 750 mg twice a day.  No change in mood or behavior.  No change in gait or balance.  Overall she feels that she is done well.  Returns today for an evaluation.  HISTORY 06/03/17 Ms. Badour is a 44 year old female with a history of seizures.  She returns today for follow-up.  She denies any seizure events.  She continues on Keppra 750 mg twice a day.  She is able to complete all ADLs independently.  Denies any change in mood or behavior.  No changes in her gait or balance.  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  She returns today for an evaluation.   REVIEW OF SYSTEMS: Out of a complete 14 system review of symptoms, the patient complains only of the following symptoms, and all other reviewed systems are negative.  See HPI  ALLERGIES: Allergies  Allergen Reactions   Metformin And Related Nausea Only    HOME MEDICATIONS: Outpatient Medications Prior to Visit  Medication Sig Dispense Refill   atorvastatin (LIPITOR) 20 MG tablet Take 20 mg by mouth daily.  1   cholecalciferol (VITAMIN  D) 1000 units tablet Take 1,000 Units by mouth daily.     insulin glargine (LANTUS) 100 UNIT/ML injection Inject 40 Units into the skin at bedtime.     labetalol (NORMODYNE) 200 MG tablet Take 200 mg by mouth 2 (two) times daily.  2   levETIRAcetam (KEPPRA) 750 MG tablet Take 1 tablet (750 mg total) by mouth 2 (two) times daily. 180 tablet 3   lisinopril (PRINIVIL,ZESTRIL) 10 MG tablet TAKE ONE TABLET (10 MG TOTAL) BY MOUTH DAILY.  1   Multiple Vitamins-Minerals (MULTIVITAMIN ADULT PO) Take by mouth.     Semaglutide,0.25 or 0.5MG /DOS, (OZEMPIC, 0.25 OR 0.5 MG/DOSE,) 2 MG/1.5ML SOPN Inject 0.5 mg into the skin.     No facility-administered medications prior to visit.    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 12/07/2012   Seizures (HCC)     PAST SURGICAL HISTORY: Past Surgical History:  Procedure Laterality Date   CESAREAN SECTION N/A 09/23/2015   Procedure: CESAREAN SECTION;  Surgeon: Marcelle Overlie, MD;  Location: [facility] BIRTHING SUIT[facility];  Service: Obstetrics;  Laterality: N/A;   TONSILLECTOMY  WISDOM TOOTH EXTRACTION      FAMILY HISTORY: Family History  Problem Relation Age of Onset   Diabetic kidney disease Mother    Diabetes Mother    Hyperlipidemia Father    Diabetic kidney disease Maternal Grandmother    High blood pressure Maternal Grandmother    Hypertension Maternal Grandmother    Cancer Maternal Grandmother        breast   Breast cancer Maternal Grandmother    High blood pressure Paternal Grandmother    Diabetic kidney disease Maternal Uncle    Diabetes Maternal Uncle    Hypertension Maternal  Grandfather     SOCIAL HISTORY: Social History   Socioeconomic History   Marital status: Married    Spouse name: Not on file   Number of children: 0   Years of education: college   Highest education level: Not on file  Occupational History    Comment: Printmaker  Tobacco Use   Smoking status: Never Smoker   Smokeless tobacco: Never Used  Substance and Sexual Activity   Alcohol use: No   Drug use: No   Sexual activity: Yes    Birth control/protection: None  Other Topics Concern   Not on file  Social History Narrative   Pt is Rt handed married w one child, ALEX at this time.Pt does take in caffeine at least twice a day.Pt is employed w/ Printmaker.   Social Determinants of Health   Financial Resource Strain:    Difficulty of Paying Living Expenses:   Food Insecurity:    Worried About Programme researcher, broadcasting/film/video in the Last Year:    Barista in the Last Year:   Transportation Needs:    Freight forwarder (Medical):    Lack of Transportation (Non-Medical):   Physical Activity:    Days of Exercise per Week:    Minutes of Exercise per Session:   Stress:    Feeling of Stress :   Social Connections:    Frequency of Communication with Friends and Family:    Frequency of Social Gatherings with Friends and Family:    Attends Religious Services:    Active Member of Clubs or Organizations:    Attends Engineer, structural:    Marital Status:   Intimate Partner Violence:    Fear of Current or Ex-Partner:    Emotionally Abused:    Physically Abused:    Sexually Abused:       PHYSICAL EXAM Generalized: Well developed, in no acute distress   Neurological examination  Mentation: Alert oriented to time, place, history taking. Follows all commands speech and language fluent Cranial nerve II-XII:Extraocular movements were full. Facial symmetry noted. uvula tongue midline. Head turning and shoulder shrug  were normal and  symmetric. Motor: Good strength throughout subjectively per patient Sensory: Sensory testing is intact to soft touch on all 4 extremities subjectively per patient Coordination: Cerebellar testing reveals good finger-nose-finger  Gait and station: Patient is able to stand from a seated position. gait is normal.  Reflexes: UTA  DIAGNOSTIC DATA (LABS, IMAGING, TESTING) - I reviewed patient records, labs, notes, testing and imaging myself where available.  Lab Results  Component Value Date   WBC 15.0 (H) 09/24/2015   HGB 10.5 (L) 09/24/2015   HCT 30.7 (L) 09/24/2015   MCV 83.0 09/24/2015   PLT 269 09/24/2015      ASSESSMENT AND PLAN 44 y.o. year old female  has a past medical history of AMA (advanced maternal age)  primigravida 35+, Complex partial seizures (HCC), Diabetes mellitus without complication (HCC), Headache(784.0), Hypertension, Newborn product of IVF pregnancy, Obesities, morbid (HCC), Other forms of epilepsy and recurrent seizures without mention of intractable epilepsy (12/07/2012), and Seizures (HCC). here with:  1.  Seizures   Continue Keppra 750 mg twice a day  Advised if she has any seizure event she should let us know  Follow-up in 1 year or sooner if needed  I spent 20 minutes of face-to-face and non-face-to-face time with patient.  This included previsit chart review, lab review, study review, order entry, electronic health record documentation, patient education.    Butch Penny, MSN, NP-C 10/11/2019, 8:13 AM [facility] [address] [place], [place] [address] [phone]

## 2020-09-24 ENCOUNTER — Other Ambulatory Visit: Payer: Self-pay | Admitting: Adult Health

## 2020-10-15 ENCOUNTER — Telehealth (INDEPENDENT_AMBULATORY_CARE_PROVIDER_SITE_OTHER): Payer: BC Managed Care – PPO | Admitting: Adult Health

## 2020-10-15 DIAGNOSIS — G40209 Localization-related (focal) (partial) symptomatic epilepsy and epileptic syndromes with complex partial seizures, not intractable, without status epilepticus: Secondary | ICD-10-CM | POA: Diagnosis not present

## 2020-10-15 MED ORDER — LEVETIRACETAM 750 MG PO TABS: 750.0000 mg | ORAL_TABLET | Freq: Two times a day (BID) | ORAL | 3 refills | Status: DC

## 2020-10-15 NOTE — Progress Notes (Signed)
PATIENT: Laurie Trujillo DOB: 1976-02-12  REASON FOR VISIT: follow up HISTORY FROM: patient  Virtual Visit via Video Note  I connected with Laurie Trujillo on 10/15/20 at  8:00 AM EDT by a video enabled telemedicine application located remotely at [facility] and verified that I am speaking with the correct person using two identifiers who was located in their car   I discussed the limitations of evaluation and management by telemedicine and the availability of in person appointments. The patient expressed understanding and agreed to proceed.   PATIENT: Laurie Trujillo DOB: 05-09-1975  REASON FOR VISIT: follow up HISTORY FROM: patient Primary Neurologist: Dohmeier  HISTORY OF PRESENT ILLNESS: Today 10/15/20:  Laurie Trujillo is a 45 year old female with a history of seizures. She returns today for follow-up. She remains for on Keppra 750 mg BID. Reports that it is working well. No seizures. 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  She returns today for follow-up.  10/11/19: Laurie Trujillo is a 45 year old female with a history of seizures.  She returns today for follow-up.  She reports that she is not had any seizure events.  She remains on Keppra 750 mg twice a day.  No change in mood or behavior.  No change in gait or balance.  Overall she feels that she is done well.  Returns today for an evaluation.  HISTORY 06/03/17 Laurie Trujillo is a 45 year old female with a history of seizures.  She returns today for follow-up.  She denies any seizure events.  She continues on Keppra 750 mg twice a day.  She is able to complete all ADLs independently.  Denies any change in mood or behavior.  No changes in her gait or balance.  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  She returns today for an evaluation.   REVIEW OF SYSTEMS: Out of a complete 14 system review of symptoms, the patient complains only of the following symptoms, and all other reviewed systems  are negative.  See HPI  ALLERGIES: Allergies  Allergen Reactions   Metformin And Related Nausea Only    HOME MEDICATIONS: Outpatient Medications Prior to Visit  Medication Sig Dispense Refill   atorvastatin (LIPITOR) 20 MG tablet Take 20 mg by mouth daily.  1   cholecalciferol (VITAMIN D) 1000 units tablet Take 1,000 Units by mouth daily.     insulin glargine (LANTUS) 100 UNIT/ML injection Inject 40 Units into the skin at bedtime.     labetalol (NORMODYNE) 200 MG tablet Take 200 mg by mouth 2 (two) times daily.  2   levETIRAcetam (KEPPRA) 750 MG tablet TAKE 1 TABLET (750 MG TOTAL) BY MOUTH 2 (TWO) TIMES DAILY. 180 tablet 3   lisinopril (PRINIVIL,ZESTRIL) 10 MG tablet TAKE ONE TABLET (10 MG TOTAL) BY MOUTH DAILY.  1   Multiple Vitamins-Minerals (MULTIVITAMIN ADULT PO) Take by mouth.     Semaglutide,0.25 or 0.5MG /DOS, (OZEMPIC, 0.25 OR 0.5 MG/DOSE,) 2 MG/1.5ML SOPN Inject 0.5 mg into the skin.     No facility-administered medications prior to visit.    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 12/07/2012  Seizures (HCC)     PAST SURGICAL HISTORY: Past Surgical History:  Procedure Laterality Date   CESAREAN SECTION N/A 09/23/2015   Procedure: CESAREAN SECTION;  Surgeon: Marcelle Overlie, MD;  Location: [facility] BIRTHING SUIT[facility];  Service: Obstetrics;  Laterality: N/A;   TONSILLECTOMY     WISDOM TOOTH EXTRACTION      FAMILY HISTORY: Family History  Problem Relation Age of Onset   Diabetic kidney disease Mother    Diabetes Mother    Hyperlipidemia Father    Diabetic kidney disease Maternal Grandmother    High blood pressure Maternal Grandmother     Hypertension Maternal Grandmother    Cancer Maternal Grandmother        breast   Breast cancer Maternal Grandmother    High blood pressure Paternal Grandmother    Diabetic kidney disease Maternal Uncle    Diabetes Maternal Uncle    Hypertension Maternal Grandfather     SOCIAL HISTORY: Social History   Socioeconomic History   Marital status: Married    Spouse name: Not on file   Number of children: 0   Years of education: college   Highest education level: Not on file  Occupational History    Comment: Printmaker  Tobacco Use   Smoking status: Never   Smokeless tobacco: Never  Substance and Sexual Activity   Alcohol use: No   Drug use: No   Sexual activity: Yes    Birth control/protection: None  Other Topics Concern   Not on file  Social History Narrative   Pt is Rt handed married w one child, ALEX at this time.Pt does take in caffeine at least twice a day.Pt is employed w/ Printmaker.   Social Determinants of Health   Financial Resource Strain: Not on file  Food Insecurity: Not on file  Transportation Needs: Not on file  Physical Activity: Not on file  Stress: Not on file  Social Connections: Not on file  Intimate Partner Violence: Not on file      PHYSICAL EXAM Generalized: Well developed, in no acute distress   Neurological examination  Mentation: Alert oriented to time, place, history taking. Follows all commands speech and language fluent Cranial nerve II-XII:Extraocular movements were full. Facial symmetry noted. uvula tongue midline. Head turning and shoulder shrug  were normal and symmetric. Motor: Good strength throughout subjectively per patient Sensory: Sensory testing is intact to soft touch on all 4 extremities subjectively per patient Coordination: Cerebellar testing reveals good finger-nose-finger  Gait and station: Patient is able to stand from a seated position. gait is normal.  Reflexes: UTA  DIAGNOSTIC DATA (LABS, IMAGING,  TESTING) - I reviewed patient records, labs, notes, testing and imaging myself where available.  Lab Results  Component Value Date   WBC 15.0 (H) 09/24/2015   HGB 10.5 (L) 09/24/2015   HCT 30.7 (L) 09/24/2015   MCV 83.0 09/24/2015   PLT 269 09/24/2015      ASSESSMENT AND PLAN 45 y.o. 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 here with:  1.  Seizures  Continue Keppra 750 mg twice a day Advised if she has any seizure event she should let us know Follow-up in 1 year or sooner if needed    Butch Penny, MSN, NP-C 10/15/2020,  9:32 AM [facility] [address], [address] [place], [place] [address] [phone]

## 2021-06-24 ENCOUNTER — Other Ambulatory Visit: Payer: Self-pay | Admitting: Obstetrics and Gynecology

## 2021-06-24 DIAGNOSIS — R928 Other abnormal and inconclusive findings on diagnostic imaging of breast: Secondary | ICD-10-CM

## 2021-07-07 ENCOUNTER — Other Ambulatory Visit: Payer: Self-pay

## 2021-07-14 ENCOUNTER — Ambulatory Visit
Admission: RE | Admit: 2021-07-14 | Discharge: 2021-07-14 | Disposition: A | Discharge status: Home / Self Care | Payer: BC Managed Care – PPO | Source: Ambulatory Visit | Attending: Obstetrics and Gynecology | Admitting: Obstetrics and Gynecology

## 2021-07-14 ENCOUNTER — Other Ambulatory Visit: Payer: Self-pay | Admitting: Obstetrics and Gynecology

## 2021-07-14 ENCOUNTER — Ambulatory Visit
Admission: RE | Admit: 2021-07-14 | Discharge: 2021-07-14 | Disposition: A | Discharge status: Home / Self Care | Payer: Self-pay | Source: Ambulatory Visit | Attending: Obstetrics and Gynecology | Admitting: Obstetrics and Gynecology

## 2021-07-14 DIAGNOSIS — R928 Other abnormal and inconclusive findings on diagnostic imaging of breast: Secondary | ICD-10-CM

## 2021-07-14 DIAGNOSIS — R599 Enlarged lymph nodes, unspecified: Secondary | ICD-10-CM

## 2021-07-17 ENCOUNTER — Ambulatory Visit
Admission: RE | Admit: 2021-07-17 | Discharge: 2021-07-17 | Disposition: A | Discharge status: Home / Self Care | Payer: BC Managed Care – PPO | Source: Ambulatory Visit | Attending: Obstetrics and Gynecology | Admitting: Obstetrics and Gynecology

## 2021-07-17 ENCOUNTER — Other Ambulatory Visit (HOSPITAL_COMMUNITY)
Admission: RE | Admit: 2021-07-17 | Discharge: 2021-07-17 | Disposition: A | Discharge status: Home / Self Care | Payer: BC Managed Care – PPO | Source: Ambulatory Visit | Attending: Diagnostic Radiology | Admitting: Diagnostic Radiology

## 2021-07-17 DIAGNOSIS — R599 Enlarged lymph nodes, unspecified: Secondary | ICD-10-CM

## 2021-07-18 LAB — SURGICAL PATHOLOGY

## 2021-10-14 ENCOUNTER — Telehealth (INDEPENDENT_AMBULATORY_CARE_PROVIDER_SITE_OTHER): Payer: BC Managed Care – PPO | Admitting: Adult Health

## 2021-10-14 DIAGNOSIS — G40209 Localization-related (focal) (partial) symptomatic epilepsy and epileptic syndromes with complex partial seizures, not intractable, without status epilepticus: Secondary | ICD-10-CM | POA: Diagnosis not present

## 2021-10-14 MED ORDER — LEVETIRACETAM 750 MG PO TABS: 750.0000 mg | ORAL_TABLET | Freq: Two times a day (BID) | ORAL | 3 refills | Status: DC

## 2021-10-14 NOTE — Progress Notes (Signed)
PATIENT: Laurie Trujillo DOB: 1975/08/25  REASON FOR VISIT: follow up HISTORY FROM: patient  Virtual Visit via Video Note  I connected with Laurie Trujillo on 10/14/21 at  3:00 PM EDT by a video enabled telemedicine application located remotely at [facility] and verified that I am speaking with the correct person using two identifiers who was located in their car   I discussed the limitations of evaluation and management by telemedicine and the availability of in person appointments. The patient expressed understanding and agreed to proceed.   PATIENT: Laurie Trujillo DOB: 09-16-1975  REASON FOR VISIT: follow up HISTORY FROM: patient Primary Neurologist: Dohmeier  HISTORY OF PRESENT ILLNESS: Today 10/14/21:  Laurie Trujillo is a 46 year old female with a history of seizures.  She returns today for virtual visit.  She denies any seizure events.  Remains on Keppra 750 mg twice a day.  Continues to operate a motor vehicle.  Able to complete all ADLs independently.  She returns today for an evaluation.  10/15/20: Laurie Trujillo is a 46 year old female with a history of seizures. She returns today for follow-up. She remains for on Keppra 750 mg BID. Reports that it is working well. No seizures. 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  She returns today for follow-up.  10/11/19: Laurie Trujillo is a 46 year old female with a history of seizures.  She returns today for follow-up.  She reports that she is not had any seizure events.  She remains on Keppra 750 mg twice a day.  No change in mood or behavior.  No change in gait or balance.  Overall she feels that she is done well.  Returns today for an evaluation.  HISTORY 06/03/17 Laurie Trujillo is a 46 year old female with a history of seizures.  She returns today for follow-up.  She denies any seizure events.  She continues on Keppra 750 mg twice a day.  She is able to complete all ADLs independently.  Denies any change in mood or  behavior.  No changes in her gait or balance.  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  She returns today for an evaluation.   REVIEW OF SYSTEMS: Out of a complete 14 system review of symptoms, the patient complains only of the following symptoms, and all other reviewed systems are negative.  See HPI  ALLERGIES: Allergies  Allergen Reactions   Metformin And Related Nausea Only    HOME MEDICATIONS: Outpatient Medications Prior to Visit  Medication Sig Dispense Refill   atorvastatin (LIPITOR) 20 MG tablet Take 20 mg by mouth daily.  1   cholecalciferol (VITAMIN D) 1000 units tablet Take 1,000 Units by mouth daily.     insulin glargine (LANTUS) 100 UNIT/ML injection Inject 40 Units into the skin at bedtime.     labetalol (NORMODYNE) 200 MG tablet Take 200 mg by mouth 2 (two) times daily.  2   levETIRAcetam (KEPPRA) 750 MG tablet Take 1 tablet (750 mg total) by mouth 2 (two) times daily. 180 tablet 3   lisinopril (PRINIVIL,ZESTRIL) 10 MG tablet TAKE ONE TABLET (10 MG TOTAL) BY MOUTH DAILY.  1   Multiple Vitamins-Minerals (MULTIVITAMIN ADULT PO) Take by mouth.     Semaglutide,0.25 or 0.5MG /DOS, (OZEMPIC, 0.25 OR 0.5 MG/DOSE,) 2 MG/1.5ML SOPN Inject 0.5 mg into the skin.     No facility-administered medications prior to visit.    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 12/07/2012   Seizures (HCC)     PAST SURGICAL HISTORY: Past Surgical History:  Procedure Laterality Date   CESAREAN SECTION N/A 09/23/2015   Procedure: CESAREAN SECTION;  Surgeon: Marcelle Overlie, MD;  Location: [facility];  Service:  Obstetrics;  Laterality: N/A;   TONSILLECTOMY     WISDOM TOOTH EXTRACTION      FAMILY HISTORY: Family History  Problem Relation Age of Onset   Diabetic kidney disease Mother    Diabetes Mother    Hyperlipidemia Father    Diabetic kidney disease Maternal Grandmother    High blood pressure Maternal Grandmother    Hypertension Maternal Grandmother    Cancer Maternal Grandmother        breast   Breast cancer Maternal Grandmother    High blood pressure Paternal Grandmother    Diabetic kidney disease Maternal Uncle    Diabetes Maternal Uncle    Hypertension Maternal Grandfather     SOCIAL HISTORY: Social History   Socioeconomic History   Marital status: Married    Spouse name: Not on file   Number of children: 0   Years of education: college   Highest education level: Not on file  Occupational History    Comment: Printmaker  Tobacco Use   Smoking status: Never   Smokeless tobacco: Never  Substance and Sexual Activity   Alcohol use: No   Drug use: No   Sexual activity: Yes    Birth control/protection: None  Other Topics Concern   Not on file  Social History Narrative   Pt is Rt handed married w one child, ALEX at this time.Pt does take in caffeine at least twice a day.Pt is employed w/ Printmaker.   Social Determinants of Health   Financial Resource Strain: Not on file  Food Insecurity: Not on file  Transportation Needs: Not on file  Physical Activity: Not on file  Stress: Not on file  Social Connections: Not on file  Intimate Partner Violence: Not on file      PHYSICAL EXAM Generalized: Well developed, in no acute distress   Neurological examination  Mentation: Alert oriented to time, place, history taking. Follows all commands speech and language fluent Cranial nerve II-XII:Extraocular movements were full. Facial symmetry noted. Marland Kitchen Head turning and shoulder shrug  were normal and symmetric.   DIAGNOSTIC DATA (LABS, IMAGING, TESTING) - I  reviewed patient records, labs, notes, testing and imaging myself where available.  Lab Results  Component Value Date   WBC 15.0 (H) 09/24/2015   HGB 10.5 (L) 09/24/2015   HCT 30.7 (L) 09/24/2015   MCV 83.0 09/24/2015   PLT 269 09/24/2015      ASSESSMENT AND PLAN 46 y.o. 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 here with:  1.  Seizures  Continue Keppra 750 mg twice a day Advised if she has any seizure event she should let us know Follow-up in 1 year  or sooner if needed    Butch Penny, MSN, NP-C 10/14/2021, 2:58 PM [facility] [address] [place], [place] [address] [phone]

## 2022-06-02 ENCOUNTER — Ambulatory Visit (INDEPENDENT_AMBULATORY_CARE_PROVIDER_SITE_OTHER): Payer: No Typology Code available for payment source | Admitting: Internal Medicine

## 2022-06-02 ENCOUNTER — Other Ambulatory Visit: Payer: Self-pay

## 2022-06-02 ENCOUNTER — Encounter: Payer: Self-pay | Admitting: Internal Medicine

## 2022-06-02 VITALS — BP 130/82 | HR 93 | Temp 98.4°F | Resp 16 | Ht 61.0 in | Wt 168.7 lb

## 2022-06-02 DIAGNOSIS — I10 Essential (primary) hypertension: Secondary | ICD-10-CM

## 2022-06-02 DIAGNOSIS — J302 Other seasonal allergic rhinitis: Secondary | ICD-10-CM | POA: Diagnosis not present

## 2022-06-02 DIAGNOSIS — J343 Hypertrophy of nasal turbinates: Secondary | ICD-10-CM | POA: Diagnosis not present

## 2022-06-02 DIAGNOSIS — R0981 Nasal congestion: Secondary | ICD-10-CM

## 2022-06-02 DIAGNOSIS — Z79899 Other long term (current) drug therapy: Secondary | ICD-10-CM

## 2022-06-02 DIAGNOSIS — J3089 Other allergic rhinitis: Secondary | ICD-10-CM

## 2022-06-02 MED ORDER — AZELASTINE HCL 0.1 % NA SOLN: 1.0000 | Freq: Two times a day (BID) | NASAL | 5 refills | Status: AC

## 2022-06-02 MED ORDER — FLUTICASONE PROPIONATE 50 MCG/ACT NA SUSP: 2.0000 | Freq: Every day | NASAL | 5 refills | Status: AC

## 2022-06-02 MED ORDER — FEXOFENADINE HCL 180 MG PO TABS
180.0000 mg | ORAL_TABLET | Freq: Every day | ORAL | 5 refills | Status: AC
Start: 2022-06-02 — End: ?

## 2022-06-02 NOTE — Progress Notes (Signed)
NEW PATIENT  Date of Service/Encounter:  06/02/22  Consult requested by: Katherina Mires, MD   Subjective:   Laurie Trujillo (DOB: Apr 07, 1975) is a 47 y.o. female who presents to the clinic on 06/02/2022 with a chief complaint of Allergy Testing .    History obtained from: chart review and patient.   Rhinitis:  Started in 69s.  Symptoms include:  sinus pressure, nasal congestion, rhinorrhea, and post nasal drainage  Occurs seasonally-Spring Potential triggers: dust, pet dander   Treatments tried:  Nasacort but caused worsening congestion Saline spray causes a lot of dripping  Allegra  PRN  Previous allergy testing: yes about 30 years ago and can't recall exact results  History of reflux/heartburn: no History of sinus surgery: no Nonallergic triggers: spicy food   Of note, she is on a beta blocker for HTN.    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 12/07/2012   Seizures    Past Surgical History: Past Surgical History:  Procedure Laterality Date   ADENOIDECTOMY     CESAREAN SECTION N/A 09/23/2015   Procedure: CESAREAN SECTION;  Surgeon: Dian Queen, MD;  Location: [place];  Service: Obstetrics;  Laterality: N/A;   TONSILLECTOMY     TYMPANOSTOMY TUBE PLACEMENT     WISDOM TOOTH EXTRACTION      Family History: Family History  Problem Relation Age of Onset   Diabetic kidney disease Mother    Diabetes Mother    Hyperlipidemia Father    Allergic rhinitis Sister    Diabetic kidney disease Maternal Uncle    Diabetes Maternal Uncle    Diabetic kidney disease Maternal Grandmother    High blood pressure Maternal Grandmother     Hypertension Maternal Grandmother    Cancer Maternal Grandmother        breast   Breast cancer Maternal Grandmother    Hypertension Maternal Grandfather    High blood pressure Paternal Grandmother     Social History:  Lives in a 1993 year house Flooring in bedroom: carpet Pets: none Tobacco use/exposure: none Job: customer service   Medication List:  Allergies as of 06/02/2022       Reactions   Metformin And Related Nausea Only        Medication List        Accurate as of June 02, 2022  1:40 PM. If you have any questions, ask your nurse or doctor.          atorvastatin 20 MG tablet Commonly known as: LIPITOR Take 20 mg by mouth daily.   cholecalciferol 1000 units tablet Commonly known as: VITAMIN D Take 1,000 Units by mouth daily.   insulin glargine 100 UNIT/ML injection Commonly known as: LANTUS Inject 40 Units into the skin at bedtime.   labetalol 200 MG tablet Commonly known as: NORMODYNE Take 200 mg by mouth 2 (two) times daily.   levETIRAcetam 750 MG tablet Commonly known as: KEPPRA Take 1 tablet (750 mg total) by mouth 2 (two) times daily.   lisinopril 10 MG tablet Commonly known as: ZESTRIL TAKE ONE TABLET (10 MG TOTAL) BY MOUTH DAILY.   MULTIVITAMIN ADULT PO Take by mouth.  Ozempic (0.25 or 0.5 MG/DOSE) 2 MG/1.5ML Sopn Generic drug: Semaglutide(0.25 or 0.5MG /DOS) Inject 0.5 mg into the skin.         REVIEW OF SYSTEMS: Pertinent positives and negatives discussed in HPI.   Objective:   Physical Exam: BP 130/82   Pulse 93   Temp 98.4 F (36.9 C)   Resp 16   Ht 5\' 1"  (1.549 m)   Wt 168 lb 11.2 oz (76.5 kg)   SpO2 98%   BMI 31.88 kg/m  Body mass index is 31.88 kg/m. GEN: alert, well developed HEENT: clear conjunctiva, TM grey and translucent, nose with + inferior turbinate hypertrophy, pink nasal mucosa, slight clear rhinorrhea, + cobblestoning HEART: regular rate and rhythm, no murmur LUNGS: clear to auscultation  bilaterally, no coughing, unlabored respiration ABDOMEN: soft, non distended  SKIN: no rashes or lesions  Reviewed:  05/12/2022: seen by Dr Doreene Nest with PMH of HTN, DM II, HLD on Labetalol and starting Losartan. Also on Semalgutide and statin.    03/18/2022: seen by PCP for nasal congestion for over a week.  Recommend starting nasal saline and Flonase.  Discussed limited utility in seeing ENT for for acute congestion.   01/08/2016: seen by Ouida Sills NP for seasonal ear pain.  Started on Flonase and Debrox otic solution. Thought to be seasonal allergies.   Skin Testing:  Skin prick testing was placed, which includes aeroallergens/foods, histamine control, and saline control.  Verbal consent was obtained prior to placing test.  Patient tolerated procedure well.  Allergy testing results were read and interpreted by myself, documented by clinical staff. Adequate positive and negative control.  Results discussed with patient/family.  Airborne Adult Perc - 06/02/22 0903     Time Antigen Placed [id number]    Allergen Manufacturer Lavella Hammock    Location Back    Number of Test 58    Panel 1 Select    1. Control-Buffer 50% Glycerol Negative    2. Control-Histamine 1 mg/ml 3+    3. Albumin saline Negative    4. Apple [place] 3+    5. [place] 3+    6. [place] 3+    7. [place] Blue 3+    9. Perennial Rye 3+    10. Sweet Vernal 3+    11. Timothy 3+    12. Cocklebur Negative    13. Burweed Marshelder Negative    14. Ragweed, short Negative    15. Ragweed, Giant Negative    16. Plantain,  English Negative    17. Lamb's Quarters Negative    18. Sheep Sorrell Negative    19. Rough Pigweed Negative    20. Marsh Elder, Rough Negative    21. Mugwort, Common Negative    22. Ash mix Negative    23. Birch mix 2+    24. Beech American Negative    25. Box, Elder Negative    26. Cedar, red Negative    27. Cottonwood, [place] Negative    28. Elm mix 2+    29. Hickory 3+    30. Maple mix Negative    31. Oak,  [place] mix 3+    32. Pecan Pollen 3+    33. Pine mix Negative    34. Sycamore Eastern 2+    35. [place], Black Pollen Negative    36. Alternaria alternata Negative    37. Cladosporium Herbarum Negative    38. Aspergillus mix Negative    39. Penicillium mix Negative    40. Bipolaris sorokiniana (Helminthosporium) Negative    41.  Drechslera spicifera (Curvularia) Negative    42. Mucor plumbeus Negative    43. Fusarium moniliforme Negative    44. Aureobasidium pullulans (pullulara) Negative    45. Rhizopus oryzae Negative    46. Botrytis cinera Negative    47. Epicoccum nigrum Negative    48. Phoma betae Negative    49. Candida Albicans Negative    50. Trichophyton mentagrophytes Negative    51. Mite, D Farinae  5,000 AU/ml Negative    52. Mite, D Pteronyssinus  5,000 AU/ml Negative    53. Cat Hair 10,000 BAU/ml 3+    54.  Dog Epithelia 3+    55. Mixed Feathers Negative    56. Horse Epithelia Negative    57. Cockroach, German Negative    58. Mouse Negative    59. Tobacco Leaf Negative             Intradermal - 06/02/22 0900     Time Antigen Placed 0940    Allergen Manufacturer Lavella Hammock    Location Arm    Number of Test 9    Control Negative    Ragweed mix 2+    Weed mix 3+    Mold 1 Negative    Mold 2 Negative    Mold 3 Negative    Mold 4 Negative    Cockroach Negative    Mite mix Negative               Assessment:   1. Nasal turbinate hypertrophy   2. Nasal congestion   3. Seasonal and perennial allergic rhinitis   4. Benign essential HTN     Plan/Recommendations:  Allergic Rhinitis: - Due to turbinate hypertrophy, seasonal symptoms and unresponsive to OTC meds, performed skin testing to identify aeroallergen triggers.   - Positive skin test 06/2022: grasses, trees, cat, dog,  - Avoidance measures discussed. - Use nasal saline rinses before nose sprays such as with Neilmed Sinus Rinse.  Use distilled water.   - Use Flonase 1-2 sprays each nostril  daily. Aim upward and outward. - Use Azelastine 1-2 sprays each nostril twice daily as needed for runny nose, sneezing, drainage, congestion. Aim upward and outward. - Use Allegra 180mg  daily.  - Consider allergy shots as long term control of your symptoms by teaching your immune system to be more tolerant of your allergy triggers. Of note, she is on beta blocker for HTN, not cardiac disease.   Essential HTN - Discussed elevated BP today. She does report it is higher in physician office.  If elevated at home, follow up with PCP.  They did just recently add Losartan.   Return in about 6 weeks (around 07/14/2022).  Harlon Flor, MD [facility]

## 2022-06-02 NOTE — Patient Instructions (Addendum)
Allergic Rhinitis: - Positive skin test 06/2022: grasses, trees, cat, dog,  - Avoidance measures discussed. - Use nasal saline rinses before nose sprays such as with Neilmed Sinus Rinse.  Use distilled water.   - Use Flonase 2 sprays each nostril daily. Aim upward and outward. - Use Azelastine 2 sprays each nostril twice daily as needed for runny nose, sneezing, drainage, congestion. Aim upward and outward. - Use Allegra 180mg  daily.  - Consider allergy shots as long term control of your symptoms by teaching your immune system to be more tolerant of your allergy triggers   ALLERGEN AVOIDANCE MEASURES Pollen Avoidance Pollen levels are highest during the mid-day and afternoon.  Consider this when planning outdoor activities. Avoid being outside when the grass is being mowed, or wear a mask if the pollen-allergic person must be the one to mow the grass. Keep the windows closed to keep pollen outside of the home. Use an air conditioner to filter the air. Take a shower, wash hair, and change clothing after working or playing outdoors during pollen season. Pet Dander Keep the pet out of your bedroom and restrict it to only a few rooms. Be advised that keeping the pet in only one room will not limit the allergens to that room. Don't pet, hug or kiss the pet; if you do, wash your hands with soap and water. High-efficiency particulate air (HEPA) cleaners run continuously in a bedroom or living room can reduce allergen levels over time. Regular use of a high-efficiency vacuum cleaner or a central vacuum can reduce allergen levels. Giving your pet a bath at least once a week can reduce airborne allergen.

## 2022-07-14 ENCOUNTER — Ambulatory Visit: Payer: No Typology Code available for payment source | Admitting: Internal Medicine

## 2022-10-13 ENCOUNTER — Telehealth (INDEPENDENT_AMBULATORY_CARE_PROVIDER_SITE_OTHER): Payer: No Typology Code available for payment source | Admitting: Adult Health

## 2022-10-13 DIAGNOSIS — G40209 Localization-related (focal) (partial) symptomatic epilepsy and epileptic syndromes with complex partial seizures, not intractable, without status epilepticus: Secondary | ICD-10-CM

## 2022-10-13 DIAGNOSIS — G40909 Epilepsy, unspecified, not intractable, without status epilepticus: Secondary | ICD-10-CM | POA: Diagnosis not present

## 2022-10-13 MED ORDER — LEVETIRACETAM 750 MG PO TABS: 750.0000 mg | ORAL_TABLET | Freq: Two times a day (BID) | ORAL | 3 refills | Status: DC

## 2022-10-13 NOTE — Progress Notes (Signed)
PATIENT: Laurie Trujillo DOB: [birth year]  REASON FOR VISIT: follow up HISTORY FROM: patient  Virtual Visit via Video Note  I connected with Orma Flaming on 10/13/22 at  3:00 PM EDT by a video enabled telemedicine application located remotely at [facility] and verified that I am speaking with the correct person using two identifiers who was located in their car   I discussed the limitations of evaluation and management by telemedicine and the availability of in person appointments. The patient expressed understanding and agreed to proceed.   PATIENT: Laurie Trujillo DOB: 1975/10/03  REASON FOR VISIT: follow up HISTORY FROM: patient Primary Neurologist: Dohmeier  HISTORY OF PRESENT ILLNESS: Today 10/13/22:  Laurie Trujillo is a 47 y.o. female with a history of Seizures . Returns today for follow-up.  She denies any seizure events.  Remains on Keppra 750 mg twice a day.  She returns today for an evaluation.   10/14/21: Laurie Trujillo is a 47 year old female with a history of seizures.  She returns today for virtual visit.  She denies any seizure events.  Remains on Keppra 750 mg twice a day.  Continues to operate a motor vehicle.  Able to complete all ADLs independently.  She returns today for an evaluation.  10/15/20: Laurie Trujillo is a 47 year old female with a history of seizures. She returns today for follow-up. She remains for on Keppra 750 mg BID. Reports that it is working well. No seizures. 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  She returns today for follow-up.  10/11/19: Laurie Trujillo is a 47 year old female with a history of seizures.  She returns today for follow-up.  She reports that she is not had any seizure events.  She remains on Keppra 750 mg twice a day.  No change in mood or behavior.  No change in gait or balance.  Overall she feels that she is done well.  Returns today for an evaluation.  HISTORY 06/03/17 Laurie Trujillo is a 47 year old female with a  history of seizures.  She returns today for follow-up.  She denies any seizure events.  She continues on Keppra 750 mg twice a day.  She is able to complete all ADLs independently.  Denies any change in mood or behavior.  No changes in her gait or balance.  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  She returns today for an evaluation.   REVIEW OF SYSTEMS: Out of a complete 14 system review of symptoms, the patient complains only of the following symptoms, and all other reviewed systems are negative.  See HPI  ALLERGIES: Allergies  Allergen Reactions   Metformin And Related Nausea Only    HOME MEDICATIONS: Outpatient Medications Prior to Visit  Medication Sig Dispense Refill   atorvastatin (LIPITOR) 20 MG tablet Take 20 mg by mouth daily.  1   azelastine (ASTELIN) 0.1 % nasal spray Place 1 spray into both nostrils 2 (two) times daily. Use in each nostril as directed 30 mL 5   cholecalciferol (VITAMIN D) 1000 units tablet Take 1,000 Units by mouth daily. (Patient not taking: Reported on 06/02/2022)     fexofenadine (ALLEGRA ALLERGY) 180 MG tablet Take 1 tablet (180 mg total) by mouth daily. 30 tablet 5   fluticasone (FLONASE) 50 MCG/ACT nasal spray Place 2 sprays into both nostrils daily. 16 g 5   insulin glargine (LANTUS) 100 UNIT/ML injection Inject 40 Units into the skin at bedtime.  labetalol (NORMODYNE) 200 MG tablet Take 200 mg by mouth 2 (two) times daily.  2   levETIRAcetam (KEPPRA) 750 MG tablet Take 1 tablet (750 mg total) by mouth 2 (two) times daily. 180 tablet 3   lisinopril (PRINIVIL,ZESTRIL) 10 MG tablet TAKE ONE TABLET (10 MG TOTAL) BY MOUTH DAILY.  1   Multiple Vitamins-Minerals (MULTIVITAMIN ADULT PO) Take by mouth. (Patient not taking: Reported on 06/02/2022)     Semaglutide,0.25 or 0.5MG /DOS, (OZEMPIC, 0.25 OR 0.5 MG/DOSE,) 2 MG/1.5ML SOPN Inject 0.5 mg into the skin.     No facility-administered medications prior to visit.    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 12/07/2012   Seizures (HCC)     PAST SURGICAL HISTORY: Past Surgical History:  Procedure Laterality Date   ADENOIDECTOMY     CESAREAN SECTION N/A 09/23/2015   Procedure: CESAREAN SECTION;  Surgeon: Marcelle Overlie, MD;  Location: [facility] BIRTHING SUIT[facility];  Service: Obstetrics;  Laterality: N/A;   TONSILLECTOMY     TYMPANOSTOMY TUBE PLACEMENT     WISDOM TOOTH EXTRACTION      FAMILY HISTORY: Family History  Problem Relation Age of Onset   Diabetic kidney disease Mother    Diabetes Mother    Hyperlipidemia Father    Allergic rhinitis Sister    Diabetic kidney disease Maternal Uncle    Diabetes Maternal Uncle    Diabetic kidney disease Maternal Grandmother    High blood pressure Maternal Grandmother    Hypertension Maternal Grandmother    Cancer Maternal Grandmother        breast   Breast cancer Maternal Grandmother    Hypertension Maternal Grandfather    High blood pressure Paternal Grandmother     SOCIAL HISTORY: Social History   Socioeconomic History   Marital status: Married    Spouse name: Not on file   Number of children: 0   Years of education: college   Highest education level: Not on file  Occupational History    Comment: Printmaker  Tobacco Use   Smoking status: Never    Passive exposure: Never   Smokeless tobacco: Never  Vaping Use   Vaping status: Never Used  Substance and Sexual Activity   Alcohol use: No   Drug use: No   Sexual activity: Yes    Birth control/protection: None  Other Topics Concern   Not on file  Social History Narrative   Pt is Rt handed married w one child,  ALEX at this time.Pt does take in caffeine at least twice a day.Pt is employed w/ [employer].   Social Determinants of Health   Financial Resource Strain: Low Risk  (08/24/2022)   Received from [facility], [facility]   Overall Financial Resource Strain (CARDIA)    Difficulty of Paying Living Expenses: Not hard at all  Food Insecurity: No Food Insecurity (08/24/2022)   Received from [facility], [facility]   Hunger Vital Sign    Worried About Running Out of Food in the Last Year: Never true    Ran Out of Food in the Last Year: Never true  Transportation Needs: No Transportation Needs (08/24/2022)  Received from [employer], [facility]   [facility] - Transportation    Lack of Transportation (Medical): No    Lack of Transportation (Non-Medical): No  Physical Activity: Sufficiently Active (08/24/2022)   Received from [facility], [facility]   Exercise Vital Sign    Days of Exercise per Week: 5 days    Minutes of Exercise per Session: 30 min  Stress: No Stress Concern Present (08/24/2022)   Received from [facility], [facility] of Occupational Health - Occupational Stress Questionnaire    Feeling of Stress : Not at all  Social Connections: Moderately Integrated (08/24/2022)   Received from [facility], [facility]   Social Network    How would you rate your social network (family, work, friends)?: Adequate participation with social networks  Intimate Partner Violence: Not At Risk (08/24/2022)   Received from [facility], [facility]   HITS    Over the last 12 months how often did your partner physically hurt you?: 1    Over the last 12 months how often did your partner insult you or talk down to you?: 1    Over the last 12 months how often did your partner threaten you with physical harm?: 1    Over the last 12 months how often did your partner scream or curse at you?: 1      PHYSICAL EXAM Generalized: Well developed, in no  acute distress   Neurological examination  Mentation: Alert oriented to time, place, history taking. Follows all commands speech and language fluent Cranial nerve II-XII: Facial symmetry noted  DIAGNOSTIC DATA (LABS, IMAGING, TESTING) - I reviewed patient records, labs, notes, testing and imaging myself where available.  Lab Results  Component Value Date   WBC 15.0 (H) 09/24/2015   HGB 10.5 (L) 09/24/2015   HCT 30.7 (L) 09/24/2015   MCV 83.0 09/24/2015   PLT 269 09/24/2015      ASSESSMENT AND PLAN 47 y.o. 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 here with:  1.  Seizures  Continue Keppra 750 mg twice a day Advised if she has any seizure event she should let us know Follow-up in 1 year or sooner if needed    Butch Penny, MSN, NP-C 10/13/2022, 2:52 PM [facility] [address], [address] [place], [place] [address] [phone]

## 2023-03-12 ENCOUNTER — Encounter: Payer: Self-pay | Admitting: Internal Medicine

## 2023-03-12 ENCOUNTER — Ambulatory Visit: Payer: No Typology Code available for payment source

## 2023-03-12 ENCOUNTER — Telehealth: Payer: Self-pay | Admitting: *Deleted

## 2023-03-12 NOTE — Telephone Encounter (Signed)
Attempt to reach pt for pre-visit. LM with call back #. 

## 2023-03-12 NOTE — Progress Notes (Unsigned)
 Pt's name and DOB verified at the beginning of the pre-visit wit 2 identifiers  Pt denies any difficulty with ambulating,sitting, laying down or rolling side to side  Pt has no issues with ambulation   Pt has no issues moving head neck or swallowing  No egg or soy allergy known to patient   No issues known to pt with past sedation with any surgeries or procedures  Pt denies having issues being intubated  Patient denies ever being intubated  No FH of Malignant Hyperthermia  Pt is not on diet pills or shots  Pt is not on home 02   Pt is not on blood thinners   Pt denies issues with constipation   Pt has frequent issues with constipation RN instructed pt to use Miralax per bottles instructions a week before prep days. Pt states they will  Pt is not on dialysis  Pt denise any abnormal heart rhythms   Pt denies any upcoming cardiac testing  Pt encouraged to use to use Singlecare or Goodrx to reduce cost   Patient's chart reviewed by Norleen Schillings CNRA prior to pre-visit and patient appropriate for the [facility].  Pre-visit completed and red dot placed by patient's name on their procedure day (on provider's schedule).  .  Visit by phone  Pt states weight is   Instructed pt why it is important to and  to call if they have any changes in health or new medications. Directed them to the # given and on instructions.     Instructions reviewed. Pt given both [facility] main # and MD on call # prior to instructions.  Pt states understanding. Instructed to review again prior to procedure. Pt states they will.   Instructions sent by mail with coupon and by My Chart  Coupon sent via text to mobile phone and pt verified they received it

## 2023-03-12 NOTE — Telephone Encounter (Signed)
 Patient is returning your call. Patient is requesting a call back. Please advise.

## 2023-03-15 ENCOUNTER — Ambulatory Visit (AMBULATORY_SURGERY_CENTER): Payer: No Typology Code available for payment source | Admitting: *Deleted

## 2023-03-15 ENCOUNTER — Telehealth: Payer: Self-pay | Admitting: *Deleted

## 2023-03-15 VITALS — Ht 61.0 in | Wt 175.0 lb

## 2023-03-15 DIAGNOSIS — Z1211 Encounter for screening for malignant neoplasm of colon: Secondary | ICD-10-CM

## 2023-03-15 MED ORDER — NA SULFATE-K SULFATE-MG SULF 17.5-3.13-1.6 GM/177ML PO SOLN: 1.0000 | Freq: Once | ORAL | 0 refills | Status: AC

## 2023-03-15 NOTE — Progress Notes (Signed)
 Pt's name and DOB verified at the beginning of the pre-visit wit 2 identifiers  Pt denies any difficulty with ambulating,sitting, laying down or rolling side to side  Pt has no issues with ambulation   Pt has no issues moving head neck or swallowing  No egg or soy allergy  known to patient   No issues known to pt with past sedation with any surgeries or procedures  Pt denies having issues being intubated  No FH of Malignant Hyperthermia  Pt is not on diet pills or shots  Pt is not on home 02   Pt is not on blood thinners   Pt denies issues with constipation   Pt is not on dialysis  Pt denise any abnormal heart rhythms   Pt denies any upcoming cardiac testing  Pt encouraged to use to use Singlecare or Goodrx to reduce cost   Patient's chart reviewed by Norleen Schillings CNRA prior to pre-visit and patient appropriate for the [facility].  Pre-visit completed and red dot placed by patient's name on their procedure day (on provider's schedule).  .  Visit by phone  Pt states weight is 175 lb  Instructed pt why it is important to and  to call if they have any changes in health or new medications. Directed them to the # given and on instructions.     Instructions reviewed. Pt given both [facility] main # and MD on call # prior to instructions.  Pt states understanding. Instructed to review again prior to procedure. Pt states they will.   \ Pt has hx of seizures. Has non-motor type usually at night. Last oen on Christmas eve per pt. TE put into MD to get MD's OK to have procedure. Pt made aware that this may change procedure and she stated she understood.  Instructions sent by mail  and by My Chart  Coupon sent via text to mobile phone and pt verified they received it

## 2023-03-15 NOTE — Telephone Encounter (Signed)
 Did pre-visit with pt

## 2023-03-16 NOTE — Telephone Encounter (Signed)
 Thank you :)

## 2023-04-05 ENCOUNTER — Telehealth: Payer: Self-pay

## 2023-04-05 NOTE — Telephone Encounter (Signed)
Patient wanted to confirm parts of her instructions for the colonoscopy 04/09/23. Confirmed she can use green jello. Confirmed the red [employer] candy she had yesterday will not prevent her from having the colonoscopy on 04/09/23. She will avoid red and purple going forward. She will take only Tylenol for her menstrual pain. No further questions.

## 2023-04-06 ENCOUNTER — Encounter: Payer: Self-pay | Admitting: Internal Medicine

## 2023-04-08 ENCOUNTER — Telehealth: Payer: Self-pay

## 2023-04-08 NOTE — Telephone Encounter (Signed)
 Spoke to patient had questions regarding prep instructions address questions and concerns.

## 2023-04-09 ENCOUNTER — Encounter: Payer: Self-pay | Admitting: Internal Medicine

## 2023-04-09 ENCOUNTER — Ambulatory Visit (AMBULATORY_SURGERY_CENTER): Payer: No Typology Code available for payment source | Admitting: Internal Medicine

## 2023-04-09 VITALS — BP 137/74 | HR 83 | Temp 97.7°F | Resp 18 | Ht 61.0 in | Wt 175.0 lb

## 2023-04-09 DIAGNOSIS — K635 Polyp of colon: Secondary | ICD-10-CM | POA: Diagnosis not present

## 2023-04-09 DIAGNOSIS — K648 Other hemorrhoids: Secondary | ICD-10-CM | POA: Diagnosis not present

## 2023-04-09 DIAGNOSIS — Z1211 Encounter for screening for malignant neoplasm of colon: Secondary | ICD-10-CM | POA: Diagnosis present

## 2023-04-09 DIAGNOSIS — D125 Benign neoplasm of sigmoid colon: Secondary | ICD-10-CM

## 2023-04-09 MED ORDER — SODIUM CHLORIDE 0.9 % IV SOLN: 500.0000 mL | Freq: Once | INTRAVENOUS | Status: DC

## 2023-04-09 NOTE — Progress Notes (Signed)
 A/o x 3, VSS, gd SR's, pleased with anesthesia, report to RN

## 2023-04-09 NOTE — Progress Notes (Signed)
 Called to room to assist during endoscopic procedure.  Patient ID and intended procedure confirmed with present staff. Received instructions for my participation in the procedure from the performing physician.

## 2023-04-09 NOTE — Progress Notes (Signed)
 GASTROENTEROLOGY PROCEDURE H&P NOTE   Primary Care Physician: Rena Luke POUR, MD    Reason for Procedure:   Colon cancer screening  Plan:    Colonoscopy  Patient is appropriate for endoscopic procedure(s) in the ambulatory ([facility]) setting.  The nature of the procedure, as well as the risks, benefits, and alternatives were carefully and thoroughly reviewed with the patient. Ample time for discussion and questions allowed. The patient understood, was satisfied, and agreed to proceed.     HPI: Laurie Trujillo is a 48 y.o. female who presents for colonoscopy for colon cancer screening. Denies blood in stools, changes in bowel habits, or unintentional weight loss. Denies family history of colon cancer. This is her first colonoscopy.  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 Authorizing Provider  atorvastatin (LIPITOR) 20 MG tablet Take 20 mg by mouth daily. 02/04/16  Yes [provider]  insulin  glargine (LANTUS) 100 UNIT/ML injection Inject 40 Units into the skin at bedtime.   Yes [provider]  labetalol  (NORMODYNE ) 200 MG tablet Take 200 mg by mouth 2 (two) times daily. 02/05/16  Yes [provider]  levETIRAcetam   (KEPPRA ) 750 MG tablet Take 1 tablet (750 mg total) by mouth 2 (two) times daily. 10/13/22  Yes Millikan, Megan, NP  lisinopril (PRINIVIL,ZESTRIL) 10 MG tablet TAKE ONE TABLET (10 MG TOTAL) BY MOUTH DAILY. 02/03/16  Yes [provider]  losartan (COZAAR) 50 MG tablet Take 50 mg by mouth daily. 04/05/23  Yes [provider]  azelastine  (ASTELIN ) 0.1 % nasal spray Place 1 spray into both nostrils 2 (two) times daily. Use in each nostril as directed Patient not taking: Reported on 04/09/2023 06/02/22   Tobie Arleta SQUIBB, MD  cholecalciferol (VITAMIN D) 1000 units tablet Take 1,000 Units by mouth daily. Patient not taking: Reported on 04/09/2023    [provider]  fexofenadine  (ALLEGRA  ALLERGY ) 180 MG tablet Take 1 tablet (180 mg total) by mouth daily. Patient not taking: Reported on 04/09/2023 06/02/22   Tobie Arleta SQUIBB, MD  fluticasone  (FLONASE ) 50 MCG/ACT nasal spray Place 2 sprays into both nostrils daily. Patient not taking: Reported on 04/09/2023 06/02/22   Tobie Arleta SQUIBB, MD  insulin  lispro (HUMALOG KWIKPEN) 100 UNIT/ML KwikPen Humalog KwikPen (U-100) Insulin  100 unit/mL subcutaneous  INJECT 12 UNITS UNDER THE SKIN 3 TIMES A DAY Patient not taking: Reported on 04/09/2023    [provider]  Multiple Vitamins-Minerals (MULTIVITAMIN ADULT PO) Take by mouth. Patient not taking: Reported on 04/09/2023    [provider]  Semaglutide, 1 MG/DOSE, (OZEMPIC, 1 MG/DOSE,) 4 MG/3ML SOPN Ozempic 1 mg/dose (4 mg/3 mL) subcutaneous pen injector    [provider]    Current Outpatient Medications  Medication Sig Dispense Refill   atorvastatin (LIPITOR) 20 MG tablet Take 20 mg by mouth daily.  1   insulin  glargine (LANTUS) 100 UNIT/ML injection Inject 40 Units into the skin at bedtime.     labetalol  (NORMODYNE ) 200 MG tablet Take 200 mg by mouth 2 (two) times daily.  2   levETIRAcetam  (KEPPRA ) 750 MG tablet Take 1 tablet (750 mg total) by mouth 2 (two) times daily. 180 tablet  3   lisinopril (PRINIVIL,ZESTRIL) 10 MG tablet TAKE ONE TABLET (10 MG TOTAL) BY MOUTH DAILY.  1   losartan (COZAAR) 50 MG tablet Take 50 mg by mouth daily.     azelastine  (ASTELIN ) 0.1 % nasal spray Place 1 spray into both nostrils 2 (two) times daily. Use in each nostril as directed (Patient not taking: Reported on 04/09/2023) 30 mL 5   cholecalciferol (VITAMIN D) 1000 units tablet Take 1,000 Units by mouth daily. (Patient not taking: Reported on 04/09/2023)     fexofenadine  (ALLEGRA  ALLERGY ) 180 MG tablet Take 1 tablet (180 mg total) by mouth daily. (Patient not taking: Reported on 04/09/2023) 30 tablet 5   fluticasone  (FLONASE ) 50 MCG/ACT nasal spray Place 2 sprays into both nostrils daily. (Patient not taking: Reported on 04/09/2023) 16 g 5   insulin  lispro (HUMALOG KWIKPEN) 100 UNIT/ML KwikPen Humalog KwikPen (U-100) Insulin  100 unit/mL subcutaneous  INJECT 12 UNITS UNDER THE SKIN 3 TIMES A DAY (Patient not taking: Reported on 04/09/2023)     Multiple Vitamins-Minerals (MULTIVITAMIN ADULT PO) Take by mouth. (Patient not taking: Reported on 04/09/2023)     Semaglutide, 1 MG/DOSE, (OZEMPIC, 1 MG/DOSE,) 4 MG/3ML SOPN Ozempic 1 mg/dose (4 mg/3 mL) subcutaneous pen injector     Current Facility-Administered Medications  Medication Dose Route Frequency Provider Last Rate Last Admin   0.9 %  sodium chloride  infusion  500 mL Intravenous Once Federico Rosario BROCKS, MD        Allergies as of 04/09/2023 - Review Complete 04/09/2023  Allergen Reaction Noted   Metformin and related Nausea Only 05/28/2014    Family History  Problem Relation Age of Onset   Diabetic kidney disease Mother    Diabetes Mother    Hyperlipidemia Father    Allergic rhinitis Sister    Diabetic kidney disease Maternal Uncle    Diabetes Maternal Uncle    Diabetic kidney disease Maternal Grandmother    High blood pressure Maternal Grandmother    Hypertension Maternal Grandmother    Cancer Maternal Grandmother        breast   Breast  cancer Maternal Grandmother    Hypertension Maternal Grandfather    High blood pressure Paternal Grandmother    Colon cancer Neg Hx    Colon polyps Neg Hx    Esophageal cancer Neg Hx    Rectal cancer Neg Hx    Stomach cancer Neg Hx     Social History   Socioeconomic History   Marital status: Married    Spouse name: Not on file   Number of children: 0   Years of education: college   Highest education level: Not on file  Occupational History    Comment: Printmaker  Tobacco Use   Smoking status: Never    Passive exposure: Never   Smokeless tobacco: Never  Vaping Use   Vaping status: Never Used  Substance and Sexual Activity   Alcohol use: No   Drug use: No   Sexual activity: Yes  Birth control/protection: None  Other Topics Concern   Not on file  Social History Narrative   Pt is Rt handed married w one child, ALEX at this time.Pt does take in caffeine at least twice a day.Pt is employed w/ Printmaker.   Social Drivers of Health   Financial Resource Strain: Low Risk  (03/17/2023)   Received from [facility]   Overall Financial Resource Strain (CARDIA)    Difficulty of Paying Living Expenses: Not hard at all  Food Insecurity: No Food Insecurity (03/17/2023)   Received from [facility]   Hunger Vital Sign    Worried About Running Out of Food in the Last Year: Never true    Ran Out of Food in the Last Year: Never true  Transportation Needs: No Transportation Needs (03/17/2023)   Received from [facility] - Transportation    Lack of Transportation (Medical): No    Lack of Transportation (Non-Medical): No  Physical Activity: Sufficiently Active (08/24/2022)   Received from [facility], [facility]   Exercise Vital Sign    Days of Exercise per Week: 5 days    Minutes of Exercise per Session: 30 min  Stress: No Stress Concern Present (08/24/2022)   Received from [facility], [facility] of Occupational Health -  Occupational Stress Questionnaire    Feeling of Stress : Not at all  Social Connections: Moderately Integrated (08/24/2022)   Received from [facility], [facility]   Social Network    How would you rate your social network (family, work, friends)?: Adequate participation with social networks  Intimate Partner Violence: Not At Risk (08/24/2022)   Received from [facility], [facility]   HITS    Over the last 12 months how often did your partner physically hurt you?: Never    Over the last 12 months how often did your partner insult you or talk down to you?: Never    Over the last 12 months how often did your partner threaten you with physical harm?: Never    Over the last 12 months how often did your partner scream or curse at you?: Never    Physical Exam: Vital signs in last 24 hours: BP (!) 141/73   Pulse 95   Temp 97.7 F (36.5 C) (Skin)   Ht 5' 1 (1.549 m)   Wt 175 lb (79.4 kg)   SpO2 100%   BMI 33.07 kg/m  GEN: NAD EYE: Sclerae anicteric ENT: MMM CV: Non-tachycardic Pulm: No increased work of breathing GI: Soft, NT/ND NEURO:  Alert & Oriented   Estefana Kidney, MD [facility]  04/09/2023 8:06 AM

## 2023-04-09 NOTE — Patient Instructions (Signed)
YOU HAD AN ENDOSCOPIC PROCEDURE TODAY AT THE [facility]:   Refer to the procedure report that was given to you for any specific questions about what was found during the examination.  If the procedure report does not answer your questions, please call your gastroenterologist to clarify.  If you requested that your care partner not be given the details of your procedure findings, then the procedure report has been included in a sealed envelope for you to review at your convenience later.  **Handouts given on polyps and hemorrhoids**  YOU SHOULD EXPECT: Some feelings of bloating in the abdomen. Passage of more gas than usual.  Walking can help get rid of the air that was put into your GI tract during the procedure and reduce the bloating. If you had a lower endoscopy (such as a colonoscopy or flexible sigmoidoscopy) you may notice spotting of blood in your stool or on the toilet paper. If you underwent a bowel prep for your procedure, you may not have a normal bowel movement for a few days.  Please Note:  You might notice some irritation and congestion in your nose or some drainage.  This is from the oxygen used during your procedure.  There is no need for concern and it should clear up in a day or so.  SYMPTOMS TO REPORT IMMEDIATELY:  Following lower endoscopy (colonoscopy or flexible sigmoidoscopy):  Excessive amounts of blood in the stool  Significant tenderness or worsening of abdominal pains  Swelling of the abdomen that is new, acute  Fever of 100F or higher  For urgent or emergent issues, a gastroenterologist can be reached at any hour by calling [phone]. Do not use MyChart messaging for urgent concerns.    DIET:  We do recommend a small meal at first, but then you may proceed to your regular diet.  Drink plenty of fluids but you should avoid alcoholic beverages for 24 hours.  ACTIVITY:  You should plan to take it easy for the rest of today and you should NOT DRIVE or  use heavy machinery until tomorrow (because of the sedation medicines used during the test).    FOLLOW UP: Our staff will call the number listed on your records the next business day following your procedure.  We will call around 7:15- 8:00 am to check on you and address any questions or concerns that you may have regarding the information given to you following your procedure. If we do not reach you, we will leave a message.     If any biopsies were taken you will be contacted by phone or by letter within the next 1-3 weeks.  Please call us at [phone] if you have not heard about the biopsies in 3 weeks.    SIGNATURES/CONFIDENTIALITY: You and/or your care partner have signed paperwork which will be entered into your electronic medical record.  These signatures attest to the fact that that the information above on your After Visit Summary has been reviewed and is understood.  Full responsibility of the confidentiality of this discharge information lies with you and/or your care-partner.  

## 2023-04-09 NOTE — Op Note (Signed)
  [facility] Patient Name: Laurie Trujillo Procedure Date: 04/09/2023 8:04 AM MRN: [id number] Endoscopist: Rosario Estefana Kidney , , [id number] Age: 48 Referring MD:  Date of Birth: 11-13-1975 Gender: Female Account #: [id number] Procedure:                Colonoscopy Indications:              Screening for colorectal malignant neoplasm, This                            is the patient's first colonoscopy Medicines:                Monitored Anesthesia Care Procedure:                Pre-Anesthesia Assessment:                           - Prior to the procedure, a History and Physical                            was performed, and patient medications and                            allergies were reviewed. The patient's tolerance of                            previous anesthesia was also reviewed. The risks                            and benefits of the procedure and the sedation                            options and risks were discussed with the patient.                            All questions were answered, and informed consent                            was obtained. Prior Anticoagulants: The patient has                            taken no anticoagulant or antiplatelet agents. ASA                            Grade Assessment: II - A patient with mild systemic                            disease. After reviewing the risks and benefits,                            the patient was deemed in satisfactory condition to                            undergo the procedure.  After obtaining informed consent, the colonoscope                            was passed under direct vision. Throughout the                            procedure, the patient's blood pressure, pulse, and                            oxygen saturations were monitored continuously. The                            CF [id number] #[id number] was introduced through the anus                            and advanced to the  the terminal ileum. The                            colonoscopy was performed without difficulty. The                            patient tolerated the procedure well. The quality                            of the bowel preparation was good. The terminal                            ileum, ileocecal valve, appendiceal orifice, and                            rectum were photographed. Scope In: 8:09:29 AM Scope Out: 8:29:43 AM Scope Withdrawal Time: 0 hours 13 minutes 13 seconds  Total Procedure Duration: 0 hours 20 minutes 14 seconds  Findings:                 The terminal ileum appeared normal.                           A 4 mm polyp was found in the sigmoid colon. The                            polyp was sessile. The polyp was removed with a                            cold snare. Resection and retrieval were complete.                           Non-bleeding internal hemorrhoids were found during                            retroflexion. Complications:            No immediate complications. Estimated Blood Loss:     Estimated blood loss was minimal. Impression:               -  The examined portion of the ileum was normal.                           - One 4 mm polyp in the sigmoid colon, removed with                            a cold snare. Resected and retrieved.                           - Non-bleeding internal hemorrhoids. Recommendation:           - Discharge patient to home (with escort).                           - Await pathology results.                           - The findings and recommendations were discussed                            with the patient. Dr Estefana Federico Rosario Estefana Federico,  04/09/2023 8:35:44 AM

## 2023-04-09 NOTE — [facility] Notes (Signed)
 Pt's states no medical or surgical changes since previsit or office visit.

## 2023-04-12 ENCOUNTER — Telehealth: Payer: Self-pay | Admitting: *Deleted

## 2023-04-12 NOTE — Telephone Encounter (Signed)
  Follow up Call-     04/09/2023    7:36 AM  Call back number  Post procedure Call Back phone  # [phone]  Permission to leave phone message Yes     Patient questions:  Do you have a fever, pain , or abdominal swelling? No. Pain Score  0 *  Have you tolerated food without any problems? Yes.    Have you been able to return to your normal activities? Yes.    Do you have any questions about your discharge instructions: Diet   No. Medications  No. Follow up visit  No.  Do you have questions or concerns about your Care? No.  Actions: * If pain score is 4 or above: No action needed, pain <4.

## 2023-04-13 LAB — SURGICAL PATHOLOGY

## 2023-04-14 ENCOUNTER — Encounter: Payer: Self-pay | Admitting: Internal Medicine

## 2023-05-11 ENCOUNTER — Telehealth: Payer: Self-pay | Admitting: Adult Health

## 2023-05-11 DIAGNOSIS — G40209 Localization-related (focal) (partial) symptomatic epilepsy and epileptic syndromes with complex partial seizures, not intractable, without status epilepticus: Secondary | ICD-10-CM

## 2023-05-11 NOTE — Telephone Encounter (Signed)
   Patient (Requesting earlier appt due to checking on medication dosage due to have had 3 seizure since last office visit.)

## 2023-05-11 NOTE — Telephone Encounter (Signed)
 If she has had 3 seizure events and it's not because she missed medication then I would recommend that we increase Keppra to 1000 mg twice a day.  The last time I saw her she did not report any seizure events.  If she is having seizures even at night she should not be operating a motor vehicle.  I will also include Dr. Vickey Huger in on this.  She can certainly give her opinion if she disagrees.

## 2023-05-11 NOTE — Telephone Encounter (Signed)
 Keppra refill history:  Last seen here August 2024 for yearly follow-up.   I called the patient and discussed.  She did confirm she is taking her Keppra 750 mg twice daily as prescribed.  She has had 3 seizures since her last visit, one on December 25th, February 8th and March 6th.  She states she has noticed that if she goes to bed at midnight or after, she may have a seizure.  She said if she missed her evening dose she may have a seizure but she does not regularly miss her medication.  She said the seizures are quick and small which is typical for her.  She can hear her husband talking to her.  She may bite her tongue. They usually happen either very late at night or very early in the morning.  She states she has never had a seizure during the day. Most of the time her husband drives, occasionally she will drive if their schedules do not align.  I discussed with the patient setting a recurring alarm on her phone so she will not forget any doses.  We did discuss that any lack of sleep, missed dose, or illness, for example can trigger seizure.  The patient states she does not drink alcohol.  For now I told her we could plan to keep her appointment as she is scheduled sooner follow-up 05/25/2023.  The patient preferred to schedule a MyChart visit.  She thanked me for the call back.

## 2023-05-13 ENCOUNTER — Telehealth: Payer: Self-pay | Admitting: Neurology

## 2023-05-13 ENCOUNTER — Other Ambulatory Visit: Admitting: *Deleted

## 2023-05-13 MED ORDER — LEVETIRACETAM 1000 MG PO TABS: 1000.0000 mg | ORAL_TABLET | Freq: Two times a day (BID) | ORAL | 2 refills | Status: DC

## 2023-05-13 NOTE — Addendum Note (Signed)
 Addended by: Bertram Savin on: 05/13/2023 04:11 PM   Modules accepted: Orders

## 2023-05-13 NOTE — Telephone Encounter (Signed)
 Please make patient aware

## 2023-05-13 NOTE — Telephone Encounter (Addendum)
 From Dr Dohmeier:  I agree with increasing the dose - and  the driving restrictions in case these seizures have occurred outside of sleep. Nocturnal seizures do not require restrictions for  driving in daytime.  Repeat EEG .   Want to consider breviact  instead of Keppra in case there a depression side effects. CD   I called the patient and discussed the recommendations to increase Levetiracetam to 1000 mg BID, repeat EEG, and that she should not be driving in case these seizures happened outside of sleep. The patient verbalized understanding and agreement to the plan. She did ask about what she should do if her husband's schedule doesn't line up with hers. I encouraged her to try to find alternative rides as this is punishable by law/DOT if she is caught driving after recent seizure. Time limit of no driving is 6 months after most recent seizure. I was able to schedule her for EEG on 3/24 at 2 pm which is the day before her appt with [facility] NP. I also placed the order for Levetiracetam 1000 mg BID and instructed patient that once she gets this from the pharmacy, she can start it at the next time her 750 mg dose would have been scheduled but do not take both of them and do not take any extra doses. Pt states she is not having any s/e of depression with keppra. She will let us know if she has any trouble with the increased dose. The patient verbalized understanding and appreciation for the call. Her questions were answered.

## 2023-05-13 NOTE — Telephone Encounter (Signed)
 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 I have instructed her to report any activity that happened outside sleep and there was none.   Spoke with pt and relayed message. She will let us know she has any seizures outside of sleep. For now, no driving restrictions as long as these seizures have occurred while sleeping. Pt appreciative and will let us know if anything changes.

## 2023-05-13 NOTE — Telephone Encounter (Signed)
 Pt called back with a clarification question on the instructions for the keppra. I answered her question and she verbalized understanding.

## 2023-05-13 NOTE — Telephone Encounter (Signed)
 I called Laurie Trujillo to clarify if her seizure occurred in sleep or not. Her husband witnessed a seizure at about midnight in her sleep- she has had nocturnal or sleep epilepsy for years and this is the one type of seizure tat doesn't require DOT notification or restrictions.   Melvyn Novas, MD

## 2023-05-24 ENCOUNTER — Ambulatory Visit (INDEPENDENT_AMBULATORY_CARE_PROVIDER_SITE_OTHER): Admitting: Neurology

## 2023-05-24 DIAGNOSIS — G40209 Localization-related (focal) (partial) symptomatic epilepsy and epileptic syndromes with complex partial seizures, not intractable, without status epilepticus: Secondary | ICD-10-CM | POA: Diagnosis not present

## 2023-05-24 NOTE — Procedures (Signed)
    History:  49 year old gentleman with seizure   EEG classification: Awake and drowsy  Duration: 28 minutes   Technical aspects: This EEG study was done with scalp electrodes positioned according to the 10-20 International system of electrode placement. Electrical activity was reviewed with band pass filter of 1-70Hz , sensitivity of 7 uV/mm, display speed of 54mm/sec with a 60Hz  notched filter applied as appropriate. EEG data were recorded continuously and digitally stored.   Description of the recording: The background rhythms of this recording consists of a fairly well modulated medium amplitude alpha rhythm of 12 Hz that is reactive to eye opening and closure. Present in the anterior head region is a 15-20 Hz beta activity. Photic stimulation was performed, did not show any abnormalities. Hyperventilation was also performed, did not show any abnormalities. Drowsiness was manifested by background fragmentation. No abnormal epileptiform discharges seen during this recording. There was no focal slowing. There were no electrographic seizure identified.   Abnormality: None   Impression: This is a normal awake and drowsy EEG. No evidence of interictal epileptiform discharges. Normal EEGs, however, do not rule out epilepsy.    Windell Norfolk, MD [facility]

## 2023-05-25 ENCOUNTER — Telehealth (INDEPENDENT_AMBULATORY_CARE_PROVIDER_SITE_OTHER): Admitting: Adult Health

## 2023-05-25 DIAGNOSIS — G40209 Localization-related (focal) (partial) symptomatic epilepsy and epileptic syndromes with complex partial seizures, not intractable, without status epilepticus: Secondary | ICD-10-CM

## 2023-05-25 DIAGNOSIS — R569 Unspecified convulsions: Secondary | ICD-10-CM | POA: Diagnosis not present

## 2023-05-25 NOTE — Patient Instructions (Signed)
 Your Plan:  Continue Keppra 1000 mg twice a day Please let us know if you have any seizure events. Have attached some information about Keppra.     Thank you for coming to see [place] at [facility]. I hope we have been able to provide you high quality care today.  You may receive a patient satisfaction survey over the next few weeks. We would appreciate your feedback and comments so that we may continue to improve ourselves and the health of our patients.

## 2023-05-25 NOTE — Progress Notes (Signed)
 PATIENT: Laurie Trujillo DOB: Mar 16, 1975  REASON FOR VISIT: follow up HISTORY FROM: patient  Virtual Visit via Video Note  I connected with Orma Flaming on 05/25/23 at  3:15 PM EDT by a video enabled telemedicine application located remotely at [facility] and verified that I am speaking with the correct person using two identifiers who was located at work.    I discussed the limitations of evaluation and management by telemedicine and the availability of in person appointments. The patient expressed understanding and agreed to proceed.   PATIENT: Laurie Trujillo DOB: December 27, 1975  REASON FOR VISIT: follow up HISTORY FROM: patient Primary Neurologist: Dohmeier  HISTORY OF PRESENT ILLNESS: Today 05/25/23:  Laurie Trujillo is a 48 y.o. female with a history of seizures. Returns today for follow-up.  She was recently increased on Keppra 1000 mg twice a day due to having 3 nocturnal seizure events.  She reports that since the increase on Keppra she has not had any additional events that she is aware of.  Recently had EEG that was unremarkable.  States that she is tolerating the increase in Keppra well.  Denies any change in her mood or behavior.  She returns today for an evaluation.  EEG:  Impression: This is a normal awake and drowsy EEG. No evidence of interictal epileptiform discharges. Normal EEGs, however, do not rule out epilepsy.    10/13/22:Laurie Trujillo is a 48 y.o. female with a history of Seizures . Returns today for follow-up.  She denies any seizure events.  Remains on Keppra 750 mg twice a day.  She returns today for an evaluation.   10/14/21: Ms. Vantol is a 48 year old female with a history of seizures.  She returns today for virtual visit.  She denies any seizure events.  Remains on Keppra 750 mg twice a day.  Continues to operate a motor vehicle.  Able to complete all ADLs independently.  She returns today for an evaluation.  10/15/20: Ms. Goodell is a  48 year old female with a history of seizures. She returns today for follow-up. She remains for on Keppra 750 mg BID. Reports that it is working well. No seizures. 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  She returns today for follow-up.  10/11/19: Ms. Eilert is a 48 year old female with a history of seizures.  She returns today for follow-up.  She reports that she is not had any seizure events.  She remains on Keppra 750 mg twice a day.  No change in mood or behavior.  No change in gait or balance.  Overall she feels that she is done well.  Returns today for an evaluation.  HISTORY 06/03/17 Ms. Rane is a 48 year old female with a history of seizures.  She returns today for follow-up.  She denies any seizure events.  She continues on Keppra 750 mg twice a day.  She is able to complete all ADLs independently.  Denies any change in mood or behavior.  No changes in her gait or balance.  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  She returns today for an evaluation.   REVIEW OF SYSTEMS: Out of a complete 14 system review of symptoms, the patient complains only of the following symptoms, and all other reviewed systems are negative.  See HPI  ALLERGIES: Allergies  Allergen Reactions   Metformin And Related Nausea Only    HOME MEDICATIONS: Outpatient Medications Prior to Visit  Medication Sig Dispense Refill  atorvastatin (LIPITOR) 20 MG tablet Take 20 mg by mouth daily.  1   azelastine (ASTELIN) 0.1 % nasal spray Place 1 spray into both nostrils 2 (two) times daily. Use in each nostril as directed (Patient not taking: Reported on 04/09/2023) 30 mL 5   cholecalciferol (VITAMIN D) 1000 units tablet Take 1,000 Units by mouth daily. (Patient not taking: Reported on 04/09/2023)     fexofenadine (ALLEGRA ALLERGY) 180 MG tablet Take 1 tablet (180 mg total) by mouth daily. (Patient not taking: Reported on 04/09/2023) 30 tablet 5   fluticasone (FLONASE) 50 MCG/ACT nasal  spray Place 2 sprays into both nostrils daily. (Patient not taking: Reported on 04/09/2023) 16 g 5   insulin glargine (LANTUS) 100 UNIT/ML injection Inject 40 Units into the skin at bedtime.     insulin lispro (HUMALOG KWIKPEN) 100 UNIT/ML KwikPen Humalog KwikPen (U-100) Insulin 100 unit/mL subcutaneous  INJECT 12 UNITS UNDER THE SKIN 3 TIMES A DAY (Patient not taking: Reported on 04/09/2023)     labetalol (NORMODYNE) 200 MG tablet Take 200 mg by mouth 2 (two) times daily.  2   levETIRAcetam (KEPPRA) 1000 MG tablet Take 1 tablet (1,000 mg total) by mouth 2 (two) times daily. 60 tablet 2   lisinopril (PRINIVIL,ZESTRIL) 10 MG tablet TAKE ONE TABLET (10 MG TOTAL) BY MOUTH DAILY.  1   losartan (COZAAR) 50 MG tablet Take 50 mg by mouth daily.     Multiple Vitamins-Minerals (MULTIVITAMIN ADULT PO) Take by mouth. (Patient not taking: Reported on 04/09/2023)     Semaglutide, 1 MG/DOSE, (OZEMPIC, 1 MG/DOSE,) 4 MG/3ML SOPN Ozempic 1 mg/dose (4 mg/3 mL) subcutaneous pen injector     No facility-administered medications prior to visit.    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 12/07/2012   Seizures (HCC)     PAST SURGICAL HISTORY: Past Surgical History:  Procedure Laterality Date   ADENOIDECTOMY     CESAREAN SECTION N/A 09/23/2015   Procedure: CESAREAN SECTION;  Surgeon: Marcelle Overlie, MD;  Location: [facility];  Service: Obstetrics;  Laterality: N/A;   TONSILLECTOMY     TYMPANOSTOMY TUBE PLACEMENT     WISDOM TOOTH EXTRACTION      FAMILY HISTORY: Family History  Problem Relation Age of Onset   Diabetic kidney disease Mother    Diabetes Mother     Hyperlipidemia Father    Allergic rhinitis Sister    Diabetic kidney disease Maternal Uncle    Diabetes Maternal Uncle    Diabetic kidney disease Maternal Grandmother    High blood pressure Maternal Grandmother    Hypertension Maternal Grandmother    Cancer Maternal Grandmother        breast   Breast cancer Maternal Grandmother    Hypertension Maternal Grandfather    High blood pressure Paternal Grandmother    Colon cancer Neg Hx    Colon polyps Neg Hx    Esophageal cancer Neg Hx    Rectal cancer Neg Hx    Stomach cancer Neg Hx     SOCIAL HISTORY: Social History   Socioeconomic History   Marital status: Married    Spouse name: Not on  file   Number of children: 0   Years of education: college   Highest education level: Not on file  Occupational History    Comment: Printmaker  Tobacco Use   Smoking status: Never    Passive exposure: Never   Smokeless tobacco: Never  Vaping Use   Vaping status: Never Used  Substance and Sexual Activity   Alcohol use: No   Drug use: No   Sexual activity: Yes    Birth control/protection: None  Other Topics Concern   Not on file  Social History Narrative   Pt is Rt handed married w one child, ALEX at this time.Pt does take in caffeine at least twice a day.Pt is employed w/ [employer].   Social Drivers of Health   Financial Resource Strain: Low Risk  (03/17/2023)   Received from [facility]   Overall Financial Resource Strain (CARDIA)    Difficulty of Paying Living Expenses: Not hard at all  Food Insecurity: No Food Insecurity (03/17/2023)   Received from [facility]   Hunger Vital Sign    Worried About Running Out of Food in the Last Year: Never true    Ran Out of Food in the Last Year: Never true  Transportation Needs: No Transportation Needs (03/17/2023)   Received from [facility] - Transportation    Lack of Transportation (Medical): No    Lack of Transportation (Non-Medical): No  Physical  Activity: Sufficiently Active (08/24/2022)   Received from [facility], [facility]   Exercise Vital Sign    Days of Exercise per Week: 5 days    Minutes of Exercise per Session: 30 min  Stress: No Stress Concern Present (08/24/2022)   Received from [facility], [facility] - Occupational Stress Questionnaire    Feeling of Stress : Not at all  Social Connections: Moderately Integrated (08/24/2022)   Received from [facility], [facility]   Social Network    How would you rate your social network (family, work, friends)?: Adequate participation with social networks  Intimate Partner Violence: Not At Risk (08/24/2022)   Received from [facility], [facility]   HITS    Over the last 12 months how often did your partner physically hurt you?: Never    Over the last 12 months how often did your partner insult you or talk down to you?: Never    Over the last 12 months how often did your partner threaten you with physical harm?: Never    Over the last 12 months how often did your partner scream or curse at you?: Never      PHYSICAL EXAM Generalized: Well developed, in no acute distress   Neurological examination  Mentation: Alert oriented to time, place, history taking. Follows all commands speech and language fluent Cranial nerve II-XII: Facial symmetry noted  DIAGNOSTIC DATA (LABS, IMAGING, TESTING) - I reviewed patient records, labs, notes, testing and imaging myself where available.  Lab Results  Component Value Date   WBC 15.0 (H) 09/24/2015   HGB 10.5 (L) 09/24/2015   HCT 30.7 (L) 09/24/2015   MCV 83.0 09/24/2015   PLT 269 09/24/2015      ASSESSMENT AND PLAN 48 y.o. year old female  has a past medical history of AMA (advanced maternal age) primigravida 35+, Complex partial seizures (HCC), Diabetes mellitus without complication (HCC), Headache(784.0), Hypertension, Newborn product of IVF pregnancy, Obesities, morbid  (HCC), Other forms of epilepsy and recurrent seizures without  mention of intractable epilepsy (12/07/2012), and Seizures (HCC). here with:  1.  Seizures  Continue Keppra 1000 mg twice a day Reviewed potential side effects of Keppra with the patient and provided her information on her AVS Advised if she has any seizure event she should let us know Follow-up in 6 months in office or sooner if needed with Dr. Vergia Alcon, MSN, NP-C 05/25/2023, 2:54 PM [facility] [address], [address] [place], [place] [address] [phone]

## 2023-08-05 ENCOUNTER — Other Ambulatory Visit: Payer: Self-pay | Admitting: Adult Health

## 2023-08-05 DIAGNOSIS — G40209 Localization-related (focal) (partial) symptomatic epilepsy and epileptic syndromes with complex partial seizures, not intractable, without status epilepticus: Secondary | ICD-10-CM

## 2023-10-12 ENCOUNTER — Telehealth: Payer: No Typology Code available for payment source | Admitting: Adult Health

## 2023-11-25 ENCOUNTER — Ambulatory Visit: Admitting: Neurology

## 2023-11-25 ENCOUNTER — Encounter: Payer: Self-pay | Admitting: Neurology

## 2023-11-25 ENCOUNTER — Other Ambulatory Visit: Payer: Self-pay

## 2023-11-25 VITALS — BP 154/68 | HR 88 | Ht 61.0 in | Wt 180.8 lb

## 2023-11-25 DIAGNOSIS — Z5181 Encounter for therapeutic drug level monitoring: Secondary | ICD-10-CM | POA: Diagnosis not present

## 2023-11-25 DIAGNOSIS — G40209 Localization-related (focal) (partial) symptomatic epilepsy and epileptic syndromes with complex partial seizures, not intractable, without status epilepticus: Secondary | ICD-10-CM | POA: Diagnosis not present

## 2023-11-25 DIAGNOSIS — R569 Unspecified convulsions: Secondary | ICD-10-CM | POA: Insufficient documentation

## 2023-11-25 MED ORDER — LEVETIRACETAM 1000 MG PO TABS: 1000.0000 mg | ORAL_TABLET | Freq: Two times a day (BID) | ORAL | 3 refills | Status: AC

## 2023-11-25 NOTE — Patient Instructions (Signed)
 Levetiracetam  Tablets What is this medication? LEVETIRACETAM  (lee ve tye RA se tam) prevents and controls seizures in people with epilepsy. It works by calming overactive nerves in your body. This medicine may be used for other purposes; ask your health care provider or pharmacist if you have questions. COMMON BRAND NAME(S): Keppra , Roweepra  What should I tell my care team before I take this medication? They need to know if you have any of these conditions: Kidney disease Suicidal thoughts, plans, or attempt by you or a family member An unusual or allergic reaction to levetiracetam , other medications, foods, dyes, or preservatives Pregnant or trying to get pregnant Breast-feeding How should I use this medication? Take this medication by mouth with a glass of water. Follow the directions on the prescription label. Swallow the tablets whole. Do not crush or chew this medication. You may take this medication with or without food. Take your doses at regular intervals. Do not take your medication more often than directed. Do not stop taking this medication or any of your seizure medications unless instructed by your care team. Stopping your medication suddenly can increase your seizures or their severity. A special MedGuide will be given to you by the pharmacist with each prescription and refill. Be sure to read this information carefully each time. Contact your care team about the use of this medication in children. While this medication may be prescribed for children as young as 26 years of age for selected conditions, precautions do apply. Overdosage: If you think you have taken too much of this medicine contact a poison control center or emergency room at once. NOTE: This medicine is only for you. Do not share this medicine with others. What if I miss a dose? If you miss a dose, take it as soon as you can. If it is almost time for your next dose, take only that dose. Do not take double or extra  doses. What may interact with this medication? This medication may interact with the following: Carbamazepine  Colesevelam Probenecid Sevelamer This list may not describe all possible interactions. Give your health care provider a list of all the medicines, herbs, non-prescription drugs, or dietary supplements you use. Also tell them if you smoke, drink alcohol, or use illegal drugs. Some items may interact with your medicine. What should I watch for while using this medication? Visit your care team for a regular check on your progress. Wear a medical identification bracelet or chain to say you have epilepsy, and carry a card that lists all your medications. This medication may cause serious skin reactions. They can happen weeks to months after starting the medication. Contact your care team right away if you notice fevers or flu-like symptoms with a rash. The rash may be red or purple and then turn into blisters or peeling of the skin. You may also notice a red rash with swelling of the face, lips, or lymph nodes in your neck or under your arms. It is important to take this medication exactly as instructed by your care team. When first starting treatment, your dose may need to be adjusted. It may take weeks or months before your dose is stable. You should contact your care team if your seizures get worse or if you have any new types of seizures. This medication may affect your coordination, reaction time, or judgment. Do not drive or operate machinery until you know how this medication affects you. Sit up or stand slowly to reduce the risk of dizzy or fainting  spells. Drinking alcohol with this medication can increase the risk of these side effects. This medication may cause thoughts of suicide or depression. This includes sudden changes in mood, behaviors, or thoughts. These changes can happen at any time but are more common in the beginning of treatment or after a change in dose. Call your care team  right away if you experience these thoughts or worsening depression. If you become pregnant while using this medication, you may enroll in the [place] Antiepileptic Drug Pregnancy Registry by calling [id number]. This registry collects information about the safety of antiepileptic medication use during pregnancy. What side effects may I notice from receiving this medication? Side effects that you should report to your care team as soon as possible: Allergic reactions or angioedema--skin rash, itching or hives, swelling of the face, eyes, lips, tongue, arms, or legs, trouble swallowing or breathing Increase in blood pressure in children Infection--fever, chills, cough, or sore throat Loss of balance or coordination Low red blood cell level--unusual weakness or fatigue, dizziness, headache, trouble breathing Mood and behavior changes--anxiety, nervousness, confusion, hallucinations, irritability, hostility, thoughts of suicide or self-harm, worsening mood, feelings of depression Rash, fever, and swollen lymph nodes Redness, swelling, and blistering of the skin over hands and feet Trouble walking Unusual bruising or bleeding Unusual weakness or fatigue Side effects that usually do not require medical attention (report these to your care team if they continue or are bothersome): Dizziness Drowsiness Fatigue Irritability Loss of appetite This list may not describe all possible side effects. Call your doctor for medical advice about side effects. You may report side effects to FDA at 1-800-FDA-1088. Where should I keep my medication? Keep out of reach of children. Store at room temperature between 15 and 30 degrees C (59 and 86 degrees F). Throw away any unused medication after the expiration date. NOTE: This sheet is a summary. It may not cover all possible information. If you have questions about this medicine, talk to your doctor, pharmacist, or health care provider.  2024  Elsevier/Gold Standard (2023-01-29 00:00:00)

## 2023-11-25 NOTE — Addendum Note (Signed)
 Addended by: HILLIARD HEATHER CROME on: 11/25/2023 03:53 PM   Modules accepted: Orders

## 2023-11-25 NOTE — Progress Notes (Signed)
 Provider:  Dedra Gores, MD  Primary Care Physician:  Rena Luke POUR, MD [address] [place] [address]     Referring Provider: Rena Luke POUR, Md [address] [place],  [place] [address]          Chief Complaint according to patient   Patient presents with:                HISTORY OF PRESENT ILLNESS:  Laurie Trujillo is a 48 y.o. female patient who is here for revisit 11/25/2023 for seizure disorder.    Chief concern according to patient :  I have not had any new seizure activity since my medication was increased, I tolerate the medication. March 6th 2025 , February 2025 with break through seizures. All nocturnal seizures.      MM 05-25-2023;  Laurie Trujillo is a 48 y.o. female with a history of seizures. Returns today for follow-up.  She was recently increased on Keppra  1000 mg twice a day due to having 3 nocturnal seizure events.  She reports that since the increase on Keppra  she has not had any additional events that she is aware of.  Recently had EEG that was unremarkable.  States that she is tolerating the increase in Keppra  well.  Denies any change in her mood or behavior.  She returns today for an evaluation.   EEG:  Impression: This is a normal awake and drowsy EEG. No evidence of interictal epileptiform discharges. Normal EEGs, however, do not rule out epilepsy.    10/13/22:Laurie Trujillo is a 48 y.o. female with a history of Seizures . Returns today for follow-up.  She denies any seizure events.  Remains on Keppra  750 mg twice a day.  She returns today for an evaluation.       Review of Systems: Out of a complete 14 system review, the patient complains of only the following symptoms, and all other reviewed systems are negative.:   SLEEPINESS ?  How likely are you to doze in the following situations: 0 = not likely, 1 = slight chance, 2 = moderate chance, 3 = high chance  Sitting and Reading?1 Watching  Television?1 Sitting inactive in a public place (theater or meeting)?1 Lying down in the afternoon when circumstances permit?0 Sitting and talking to someone?0 Sitting quietly after lunch without alcohol?1 In a car, while stopped for a few minutes in traffic?0 As a passenger in a car for an hour without a break?0  4/ 15     Social History   Socioeconomic History   Marital status: Married    Spouse name: Not on file   Number of children: 0   Years of education: college   Highest education level: Not on file  Occupational History    Comment: Printmaker  Tobacco Use   Smoking status: Never    Passive exposure: Never   Smokeless tobacco: Never  Vaping Use   Vaping status: Never Used  Substance and Sexual Activity   Alcohol use: No   Drug use: No   Sexual activity: Yes    Birth control/protection: None  Other Topics Concern   Not on file  Social History Narrative   Pt is Rt handed married w one child, ALEX at this time.Pt does take in caffeine at least twice a day.Pt is employed w/ [employer].   Social Drivers of Health   Financial Resource Strain: Low Risk  (  03/17/2023)   Received from [facility]   Overall Financial Resource Strain (CARDIA)    Difficulty of Paying Living Expenses: Not hard at all  Food Insecurity: No Food Insecurity (03/17/2023)   Received from [facility]   Hunger Vital Sign    Within the past 12 months, you worried that your food would run out before you got the money to buy more.: Never true    Within the past 12 months, the food you bought just didn't last and you didn't have money to get more.: Never true  Transportation Needs: No Transportation Needs (03/17/2023)   Received from [facility] - Transportation    Lack of Transportation (Medical): No    Lack of Transportation (Non-Medical): No  Physical Activity: Sufficiently Active (08/24/2022)   Received from [facility]   Exercise Vital Sign    On average, how many  days per week do you engage in moderate to strenuous exercise (like a brisk walk)?: 5 days    On average, how many minutes do you engage in exercise at this level?: 30 min  Stress: No Stress Concern Present (08/24/2022)   Received from [facility] - Occupational Stress Questionnaire    Feeling of Stress : Not at all  Social Connections: Moderately Integrated (08/24/2022)   Received from [facility]   Social Network    How would you rate your social network (family, work, friends)?: Adequate participation with social networks    Family History  Problem Relation Age of Onset   Diabetic kidney disease Mother    Diabetes Mother    Hyperlipidemia Father    Allergic rhinitis Sister    Diabetic kidney disease Maternal Uncle    Diabetes Maternal Uncle    Diabetic kidney disease Maternal Grandmother    High blood pressure Maternal Grandmother    Hypertension Maternal Grandmother    Cancer Maternal Grandmother        breast   Breast cancer Maternal Grandmother    Hypertension Maternal Grandfather    High blood pressure Paternal Grandmother    Colon cancer Neg Hx    Colon polyps Neg Hx    Esophageal cancer Neg Hx    Rectal cancer Neg Hx    Stomach cancer Neg Hx     Past Medical History:  Diagnosis Date   AMA (advanced maternal age) primigravida 35+    Complex partial seizures (HCC)    Diabetes mellitus without complication (HCC)    Headache(784.0)    Hypertension    Newborn product of IVF pregnancy    Obesities, morbid (HCC)    Other forms of epilepsy and recurrent seizures without mention of intractable epilepsy 12/07/2012   Seizures (HCC)     Past Surgical History:  Procedure Laterality Date   ADENOIDECTOMY     CESAREAN SECTION N/A 09/23/2015   Procedure: CESAREAN SECTION;  Surgeon: Rosaline Cobble, MD;  Location: [facility] BIRTHING SUITES;  Service: Obstetrics;  Laterality: N/A;   TONSILLECTOMY     TYMPANOSTOMY TUBE PLACEMENT      WISDOM TOOTH EXTRACTION       Current Outpatient Medications on File Prior to Visit  Medication Sig Dispense Refill   atorvastatin (LIPITOR) 20 MG tablet Take 20 mg by mouth daily.  1   azelastine  (ASTELIN ) 0.1 % nasal spray Place 1 spray into both nostrils 2 (two) times daily. Use in each nostril as directed (Patient taking differently: Place 1 spray into both nostrils 2 (two)  times daily as needed. Use in each nostril as directed) 30 mL 5   Continuous Glucose Sensor (FREESTYLE LIBRE 2 PLUS SENSOR) MISC 1 patch by Does not apply route every 14 (fourteen) days.     fexofenadine  (ALLEGRA  ALLERGY ) 180 MG tablet Take 1 tablet (180 mg total) by mouth daily. (Patient taking differently: Take 180 mg by mouth daily as needed for allergies.) 30 tablet 5   fluticasone  (FLONASE ) 50 MCG/ACT nasal spray Place 2 sprays into both nostrils daily. (Patient taking differently: Place 2 sprays into both nostrils daily as needed.) 16 g 5   Insulin  Glargine (BASAGLAR KWIKPEN [place]) Inject 14 Units into the skin in the morning.     labetalol  (NORMODYNE ) 200 MG tablet Take 200 mg by mouth 2 (two) times daily.  2   levETIRAcetam  (KEPPRA ) 1000 MG tablet TAKE 1 TABLET BY MOUTH TWICE A DAY 60 tablet 8   lisinopril (PRINIVIL,ZESTRIL) 10 MG tablet TAKE ONE TABLET (10 MG TOTAL) BY MOUTH DAILY.  1   losartan (COZAAR) 50 MG tablet Take 50 mg by mouth daily.     Multiple Vitamins-Minerals (MULTIVITAMIN ADULT PO) Take by mouth.     Semaglutide, 1 MG/DOSE, (OZEMPIC, 1 MG/DOSE,) 4 MG/3ML SOPN Ozempic 1 mg/dose (4 mg/3 mL) subcutaneous pen injector     No current facility-administered medications on file prior to visit.    Allergies  Allergen Reactions   Metformin And Related Nausea Only     DIAGNOSTIC DATA (LABS, IMAGING, TESTING) - I reviewed patient records, labs, notes, testing and imaging myself where available.  Lab Results  Component Value Date   WBC 15.0 (H) 09/24/2015   HGB 10.5 (L) 09/24/2015   HCT 30.7 (L)  09/24/2015   MCV 83.0 09/24/2015   PLT 269 09/24/2015   No results found for: NA, K, CL, CO2, GLUCOSE, BUN, CREATININE, CALCIUM, PROT, ALBUMIN, AST, ALT, ALKPHOS, BILITOT, GFRNONAA, GFRAA No results found for: CHOL, HDL, LDLCALC, LDLDIRECT, TRIG, CHOLHDL No results found for: YHAJ8R No results found for: VITAMINB12 No results found for: TSH  PHYSICAL EXAM:  Vitals:   11/25/23 1443  BP: (!) 154/68  Pulse: 88   No data found. Body mass index is 34.16 kg/m.   Wt Readings from Last 3 Encounters:  11/25/23 180 lb 12.8 oz (82 kg)  04/09/23 175 lb (79.4 kg)  03/15/23 175 lb (79.4 kg)     Ht Readings from Last 3 Encounters:  11/25/23 5' 1 (1.549 m)  04/09/23 5' 1 (1.549 m)  03/15/23 5' 1 (1.549 m)      General: The patient is awake, alert and appears not in acute distress and groomed. Head: Normocephalic, atraumatic.  Neck is supple.  Generalized: Well developed, in no acute distress, obese female Head: normocephalic and atraumatic. Oropharynx benign , Mallampati 3 and neck circumference is 14 inches.  Neck: Supple, no carotid bruits  Cardiac: Regular rate rhythm, no murmur  Musculoskeletal: No deformity    Neurological examination  Mentation: Alert oriented to time, place, history taking. Follows all commands speech and language fluent Cranial nerve: Fundoscopic exam not done.  Pupils were equal round reactive to light extraocular movements were full, visual field were full on confrontational test. NO NYSTAGMUS.  Facial sensation and strength were normal. hearing was intact to finger rubbing bilaterally.  head turning and shoulder shrug and were normal and symmetric.Tongue protrusion into cheek strength was normal. Shoulder shrug is symmetric.  Motor: The motor testing reveals full strength and ROM of all 4 extremities, symmetric  motor tone is noted throughout.  Sensory: Sensory testing is intact vibration sensation, and  position sense on all 4 extremities.Coordination: Cerebellar testing reveals good finger-nose-finger and heel-to-shin bilaterally.   Reflexes: Deep tendon reflexes are symmetric and normal bilaterally.    Gait and Station: Normal.   ASSESSMENT AND PLAN :   48 y.o. year old female  here with:    1) longstanding seizure disorder/ epilepsy controlled on  medication. Last breakthrough seizure was  on Keppra ,  which we increased from 750 mg bid to 1000 mg bid.   2) HTN, DM is controlled, followed at [facility].   I ordered CBC diff and CMET today, refilled her medication.   RV in 12 months with NP.       I would like to thank Rena Luke POUR, MD and Rena Luke POUR, Md [address] [place],  [place] [address] for allowing me to meet with this pleasant patient.     Any patient with sleepiness should be cautioned not to drive, work at heights, or operate dangerous or heavy equipment when feeling tired or sleepy.    The patient will be seen in follow-up in the sleep clinic at [facility] for discussion of test results, sleep related symptoms and treatment compliance review, further management strategies, etc.   The referring provider will be notified of the test results.   The patient's condition requires frequent monitoring and adjustments in the treatment plan, reflecting the ongoing complexity of care.  This provider is the continuing focal point for all needed services for this condition.  After spending a total time of  25  minutes face to face and time for  history taking, physical and neurologic examination, review of laboratory studies,  personal review of imaging studies, reports and results of other testing and review of referral information / records as far as provided in visit,   Electronically signed by: Dedra Gores, MD 11/25/2023 3:14 PM  [facility] Sleep Board certified by The [employer] of Sleep Medicine and Diplomate of the  [employer]. Board certified In Neurology through the ABPN, Fellow of the [employer].

## 2023-11-26 ENCOUNTER — Ambulatory Visit: Payer: Self-pay | Admitting: Neurology

## 2023-11-26 LAB — CBC WITH DIFFERENTIAL/PLATELET
Basophils Absolute: 0 x10E3/uL (ref 0.0–0.2)
Basos: 0 %
EOS (ABSOLUTE): 0 x10E3/uL (ref 0.0–0.4)
Eos: 0 %
Hematocrit: 33 % — ABNORMAL LOW (ref 34.0–46.6)
Hemoglobin: 9.6 g/dL — ABNORMAL LOW (ref 11.1–15.9)
Immature Grans (Abs): 0 x10E3/uL (ref 0.0–0.1)
Immature Granulocytes: 0 %
Lymphocytes Absolute: 2.4 x10E3/uL (ref 0.7–3.1)
Lymphs: 27 %
MCH: 22.7 pg — ABNORMAL LOW (ref 26.6–33.0)
MCHC: 29.1 g/dL — ABNORMAL LOW (ref 31.5–35.7)
MCV: 78 fL — ABNORMAL LOW (ref 79–97)
Monocytes Absolute: 0.9 x10E3/uL (ref 0.1–0.9)
Monocytes: 10 %
Neutrophils Absolute: 5.6 x10E3/uL (ref 1.4–7.0)
Neutrophils: 63 %
Platelets: 329 x10E3/uL (ref 150–450)
RBC: 4.23 x10E6/uL (ref 3.77–5.28)
RDW: 14 % (ref 11.7–15.4)
WBC: 8.9 x10E3/uL (ref 3.4–10.8)

## 2023-11-26 LAB — COMPREHENSIVE METABOLIC PANEL WITH GFR
ALT: 21 IU/L (ref 0–32)
AST: 16 IU/L (ref 0–40)
Albumin: 3.8 g/dL — ABNORMAL LOW (ref 3.9–4.9)
Alkaline Phosphatase: 148 IU/L — ABNORMAL HIGH (ref 41–116)
BUN/Creatinine Ratio: 16 (ref 9–23)
BUN: 12 mg/dL (ref 6–24)
Bilirubin Total: <0.2 mg/dL (ref 0.0–1.2)
CO2: 20 mmol/L (ref 20–29)
Calcium: 9.1 mg/dL (ref 8.7–10.2)
Chloride: 101 mmol/L (ref 96–106)
Creatinine, Ser: 0.77 mg/dL (ref 0.57–1.00)
Globulin, Total: 2.8 g/dL (ref 1.5–4.5)
Glucose: 460 mg/dL — ABNORMAL HIGH (ref 70–99)
Potassium: 4.4 mmol/L (ref 3.5–5.2)
Sodium: 135 mmol/L (ref 134–144)
Total Protein: 6.6 g/dL (ref 6.0–8.5)
eGFR: 96 mL/min/1.73 (ref >59)

## 2024-03-24 IMAGING — US US AXILLARY NODE CORE BIOPSY LEFT
1 series · 12 of 19 positions shown · non-contrast
Comparison: None Available.
COMPARISON: None Available.

Addendum:
CLINICAL DATA: Patient with a single morphologically abnormal lymph
node in the LEFT axilla presents today for ultrasound-guided core
biopsy.

EXAM:
US AXILLARY NODE CORE BIOPSY LEFT

[Series 1: us axillary node core biopsy left · 0.07mm/px · 12 of 19 slices shown]
[im 1/19]
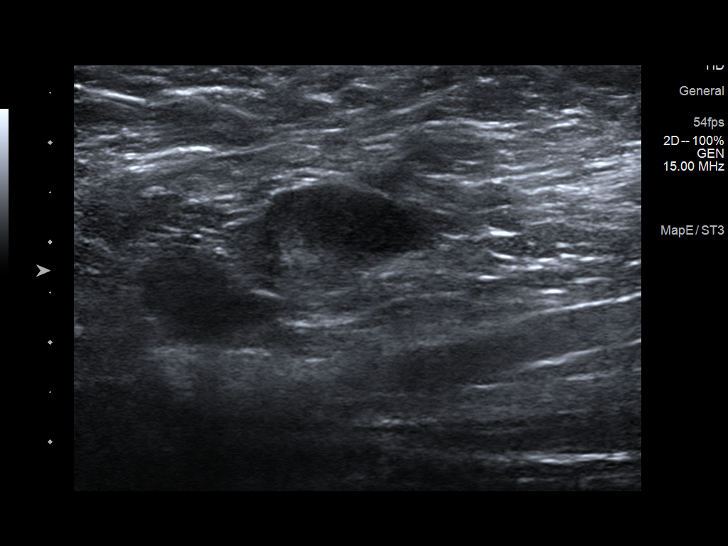
[im 3/19]
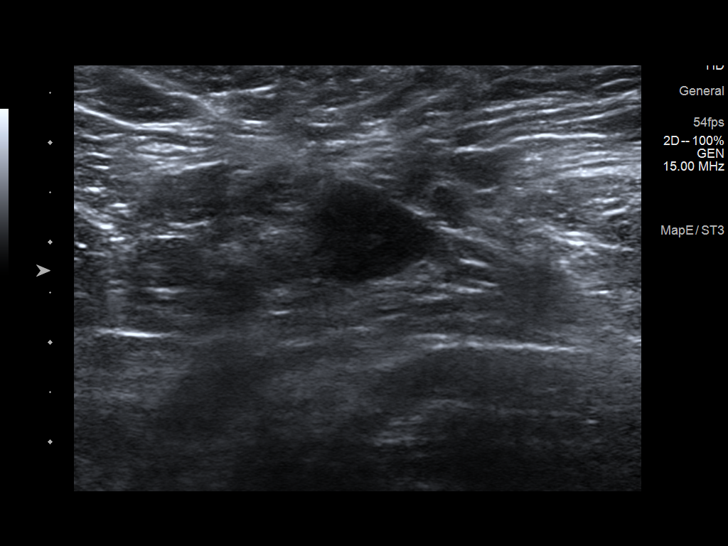
[im 4/19]
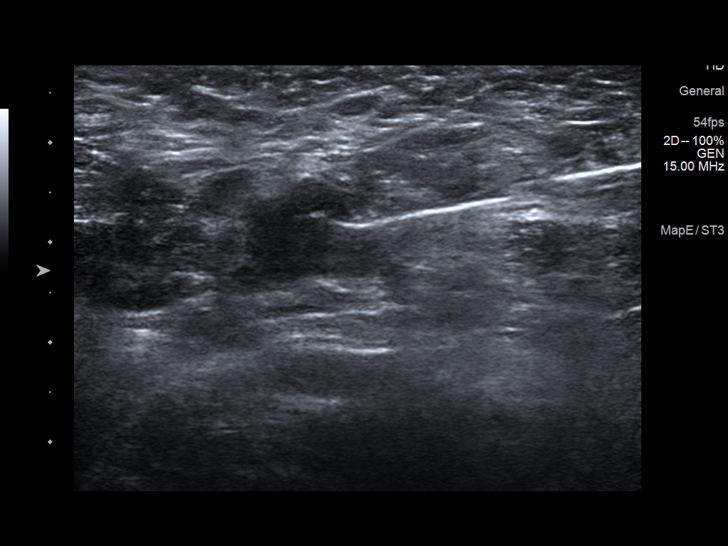
[im 6/19]
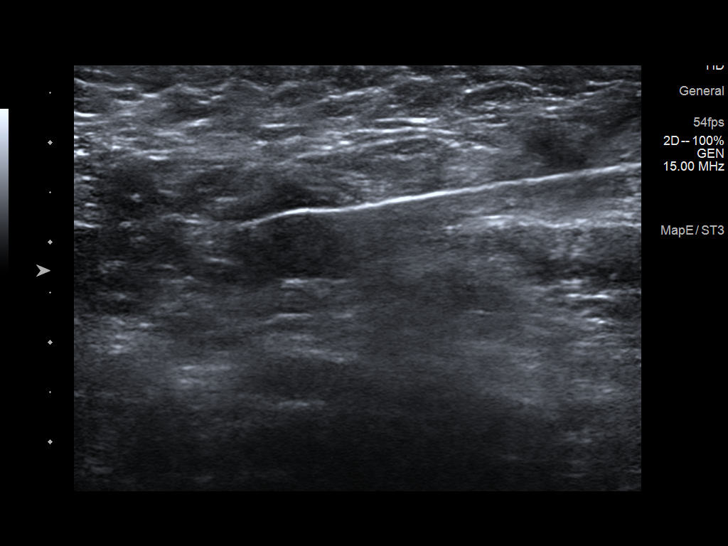
[im 8/19]
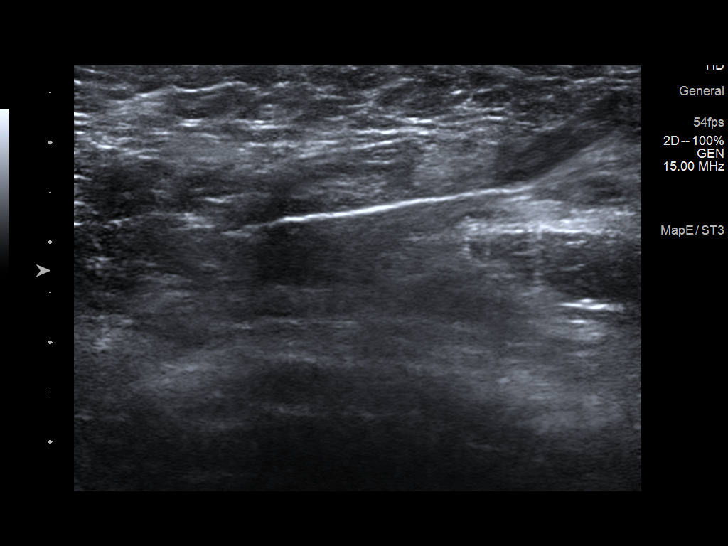
[im 9/19]
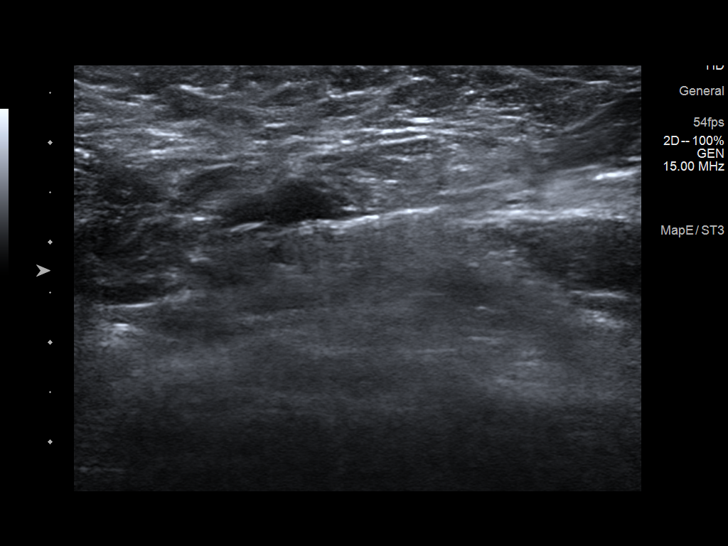
[im 11/19]
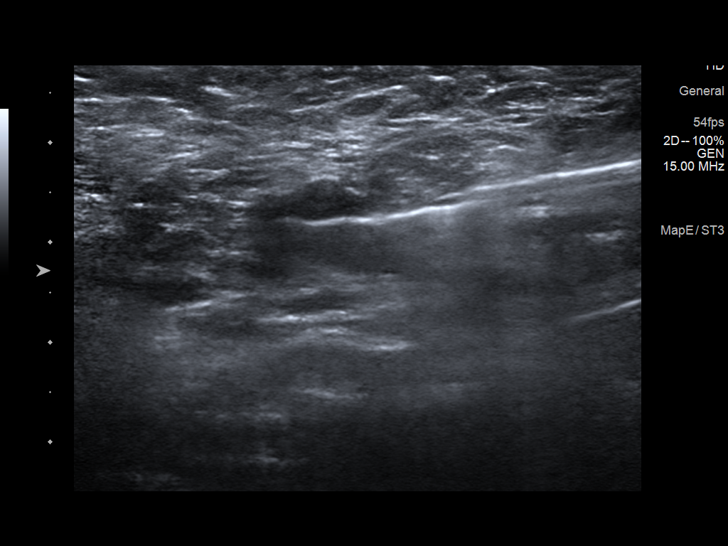
[im 12/19]
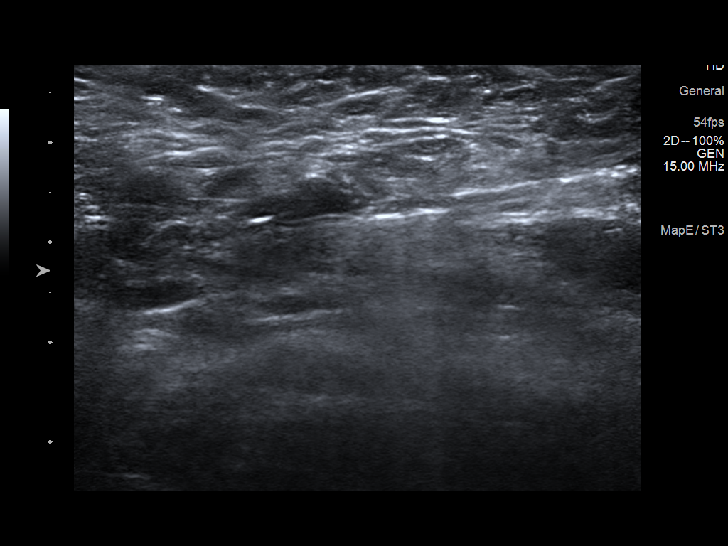
[im 14/19]
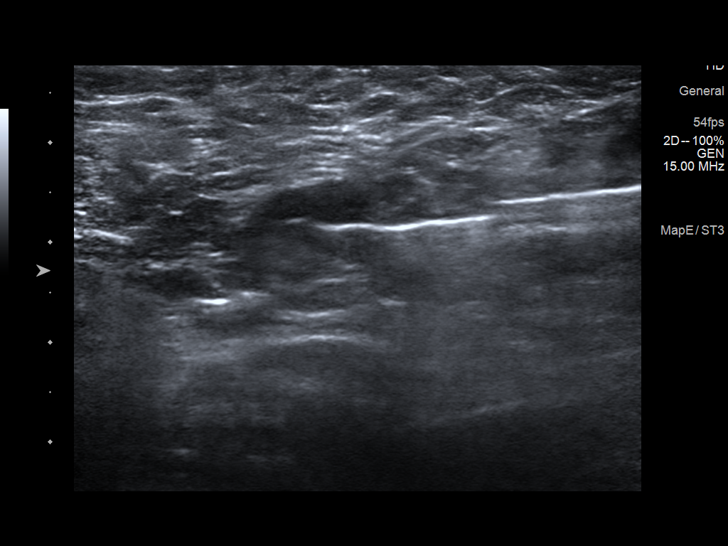
[im 16/19]
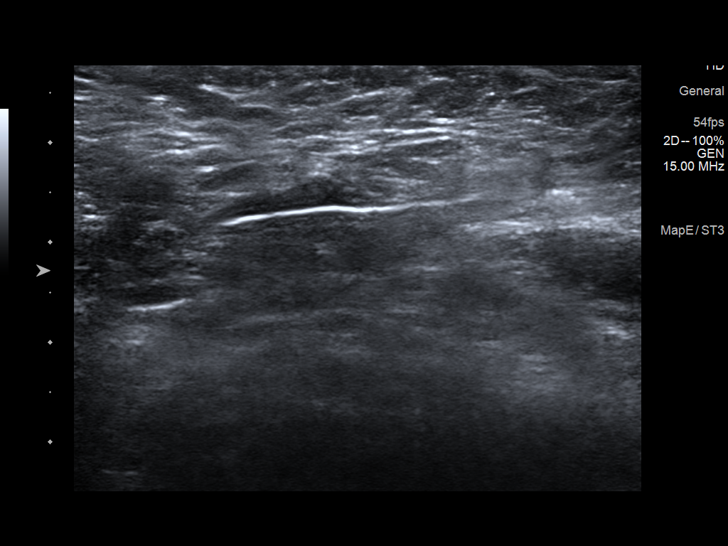
[im 17/19]
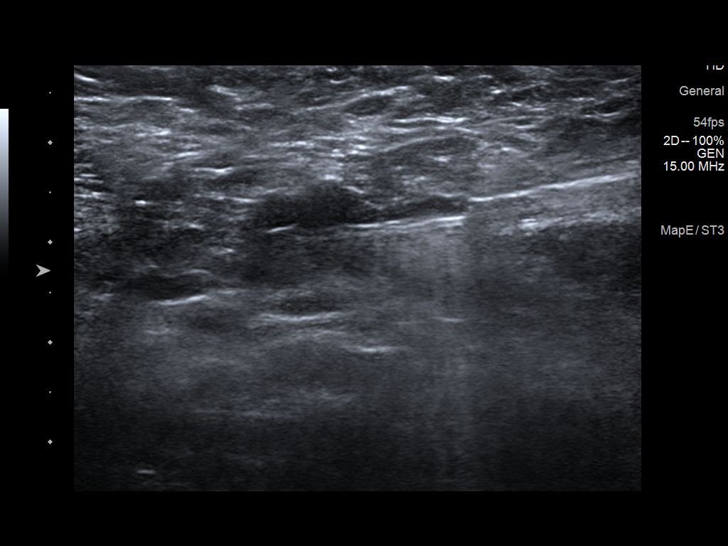
[im 19/19]
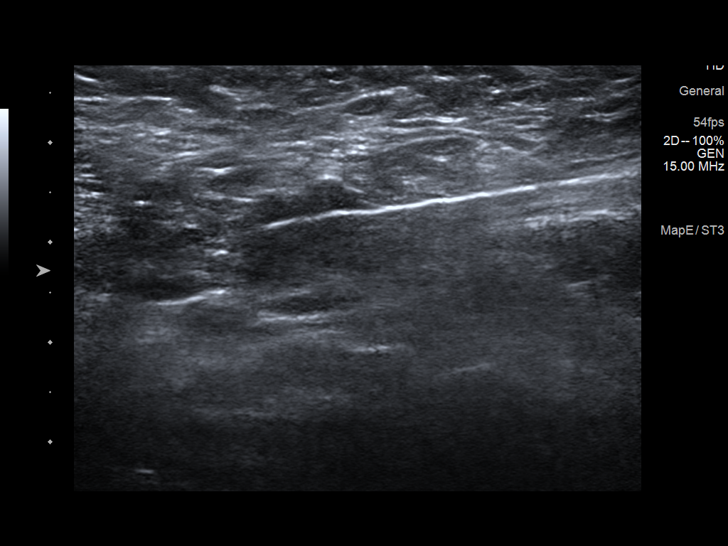

[12 of 19 positions shown; findings below may reference images not displayed]



Using sterile technique and 1% Lidocaine as local anesthetic, under
direct ultrasound visualization, a 14 gauge Ah Choon device was
used to perform biopsy of the morphologically abnormal lymph node in
the LEFT axilla using a lateral approach. Samples were placed in
both saline and formalin.

At the conclusion of the procedure , a HydroMARK tissue marker clip
was deployed into the biopsy cavity.
IMPRESSION: Ultrasound guided biopsy of a single morphologically abnormal lymph
node in the LEFT axilla. Samples placed in both saline and formalin.
No apparent complications.

ADDENDUM:
Pathology revealed Lymph node, needle/core biopsy, LEFT axilla,
(Hydromark clip) BENIGN/REACTIVE LYMPH NODE WITH FOLLICULAR
HYPERPLASIA. This was found to be concordant by Dr. Shavonne Kobe.

Flow pathology revealed No monoclonal B-cell or phenotypically
aberrant T-cell population identified. This was found to be
concordant by Dr. Shavonne Kobe.

Pathology results were discussed with the patient by telephone. The
patient reported doing well after the biopsy with tenderness at the
site. Post biopsy instructions and care were reviewed and questions
were answered. The patient was encouraged to call The [REDACTED]

The patient was instructed to return for annual screening
mammography and informed a reminder notice would be sent regarding
this appointment.

Pathology results reported by Osama Alshekh Natvik RN on 07/22/2021.



Using sterile technique and 1% Lidocaine as local anesthetic, under
direct ultrasound visualization, a 14 gauge Ah Choon device was
used to perform biopsy of the morphologically abnormal lymph node in
the LEFT axilla using a lateral approach. Samples were placed in
both saline and formalin.

At the conclusion of the procedure , a HydroMARK tissue marker clip
was deployed into the biopsy cavity.
IMPRESSION: Ultrasound guided biopsy of a single morphologically abnormal lymph
node in the LEFT axilla. Samples placed in both saline and formalin.
No apparent complications.

## 2024-03-24 IMAGING — MG MM BREAST LOCALIZATION CLIP
2 series · 3 of 6 positions shown · non-contrast
Comparison: Previous exam(s).

CLINICAL DATA: Status post ultrasound-guided biopsy for a
morphologically abnormal lymph node in the LEFT axilla.

EXAM:
3D DIAGNOSTIC LEFT MAMMOGRAM POST ULTRASOUND BIOPSY

[L MLO synth-2D]
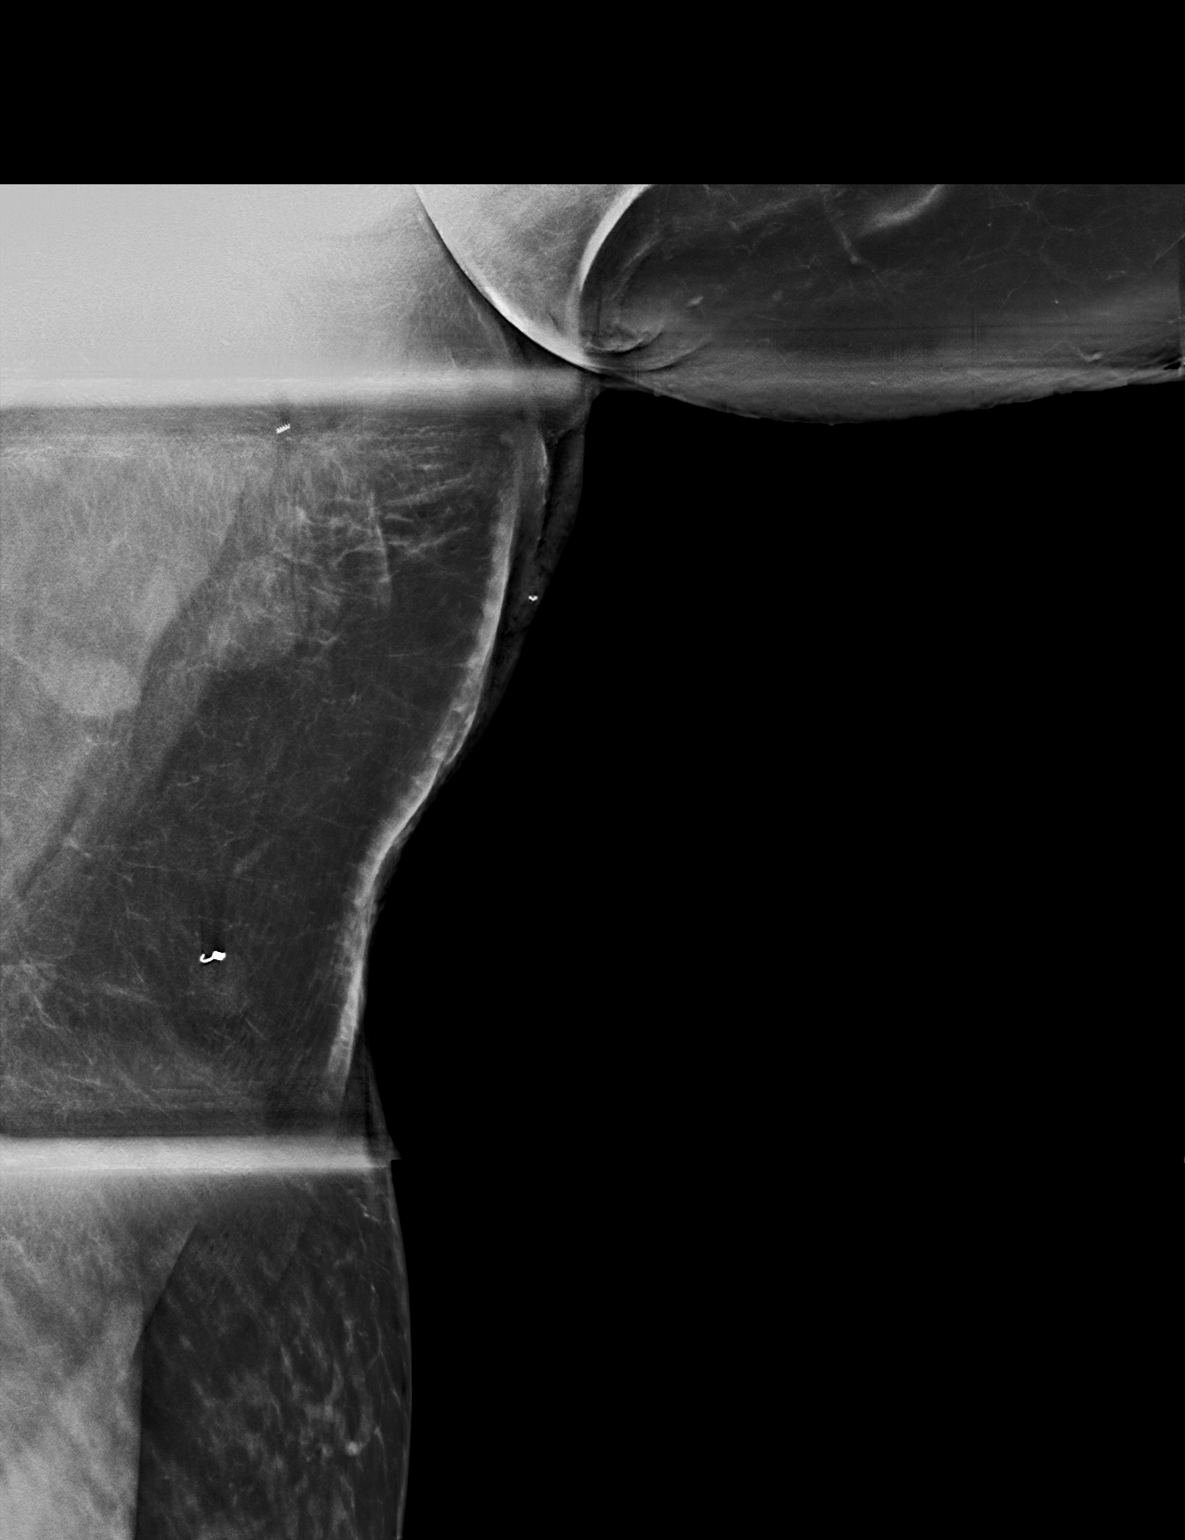

[L MLO tomo · 2 of 91 frames shown]
[frame 30/91]
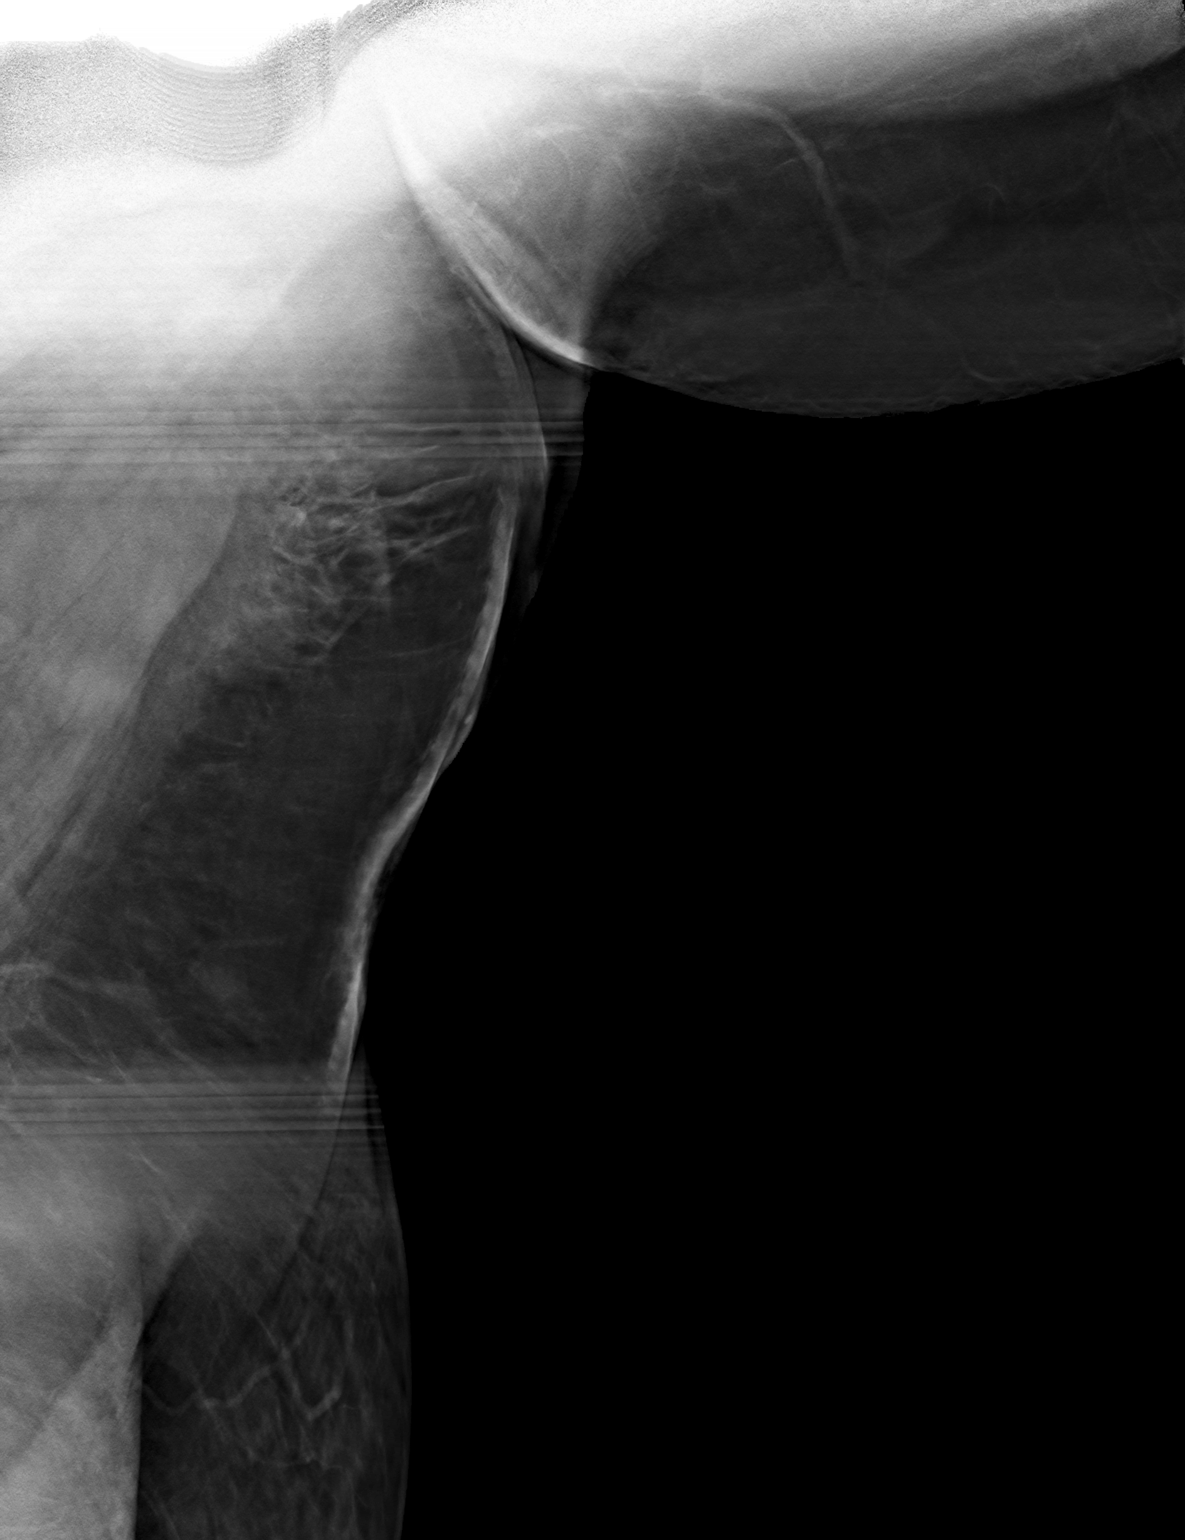
[frame 46/91]
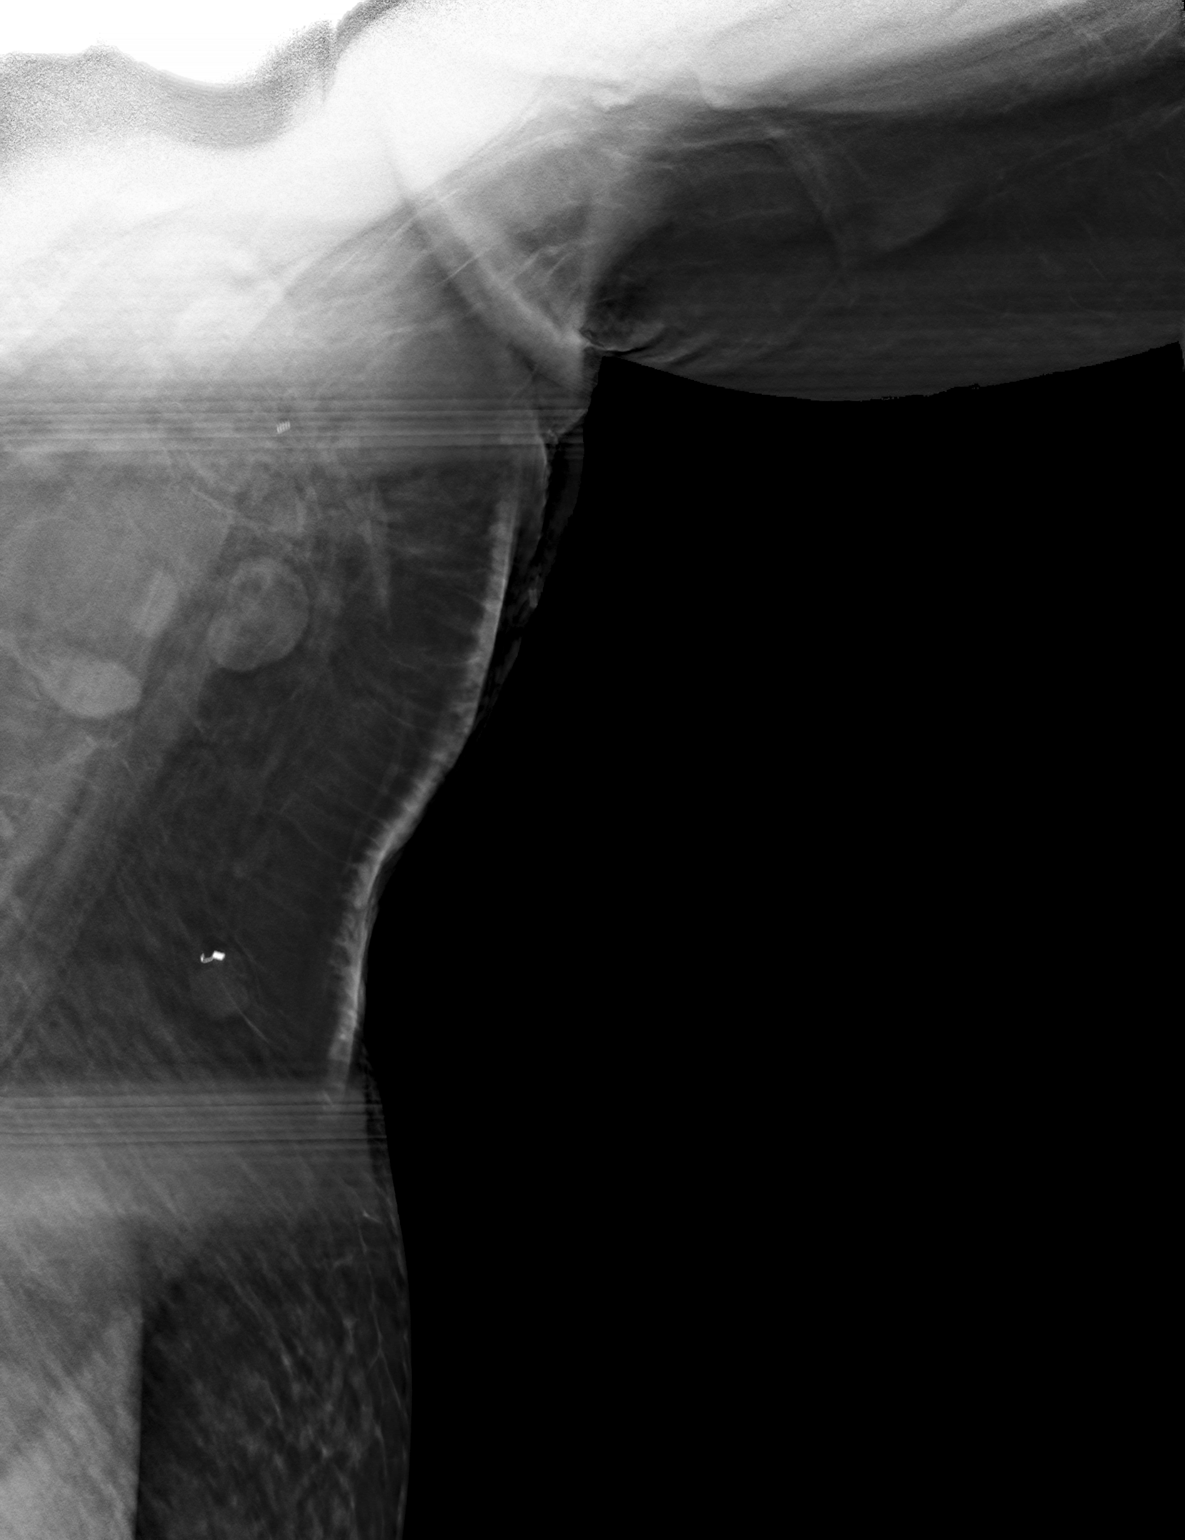

[3 of 6 positions shown; findings below may reference images not displayed]

FINDINGS: 3D Mammographic images were obtained following ultrasound guided
biopsy of the morphologically abnormal lymph node in the LEFT
axilla. The biopsy marking clip is in expected position at the site
of biopsy.
IMPRESSION: 1. Appropriate positioning of the HydroMARK shaped biopsy marking
clip within today's biopsied lymph node in the upper LEFT axilla.
2. Additional HydroMARK clip slightly lower within the LEFT axilla
corresponds to an earlier benign biopsy site.

Final Assessment: Post Procedure Mammograms for Marker Placement

## 2024-11-28 ENCOUNTER — Telehealth: Admitting: Adult Health
